# Patient Record
Sex: Female | Born: 1990 | Race: White | Hispanic: No | Marital: Single | Smoking: Never smoker
Health system: Southern US, Community
[De-identification: ages and names within clinical notes are randomized; demographics above are authoritative.]

## PROBLEM LIST (undated history)

## (undated) DIAGNOSIS — F909 Attention-deficit hyperactivity disorder, unspecified type: Secondary | ICD-10-CM

## (undated) HISTORY — DX: Attention-deficit hyperactivity disorder, unspecified type: F90.9

---

## 2014-09-28 ENCOUNTER — Telehealth: Payer: Self-pay | Admitting: Nurse Practitioner

## 2015-08-27 ENCOUNTER — Ambulatory Visit (INDEPENDENT_AMBULATORY_CARE_PROVIDER_SITE_OTHER): Payer: 59

## 2015-08-27 ENCOUNTER — Ambulatory Visit (INDEPENDENT_AMBULATORY_CARE_PROVIDER_SITE_OTHER): Payer: 59 | Admitting: Emergency Medicine

## 2015-08-27 VITALS — BP 110/66 | HR 76 | Temp 98.2°F | Resp 18 | Ht 68.0 in | Wt 147.0 lb

## 2015-08-27 DIAGNOSIS — Z309 Encounter for contraceptive management, unspecified: Secondary | ICD-10-CM

## 2015-08-27 DIAGNOSIS — M25572 Pain in left ankle and joints of left foot: Secondary | ICD-10-CM

## 2015-08-27 DIAGNOSIS — Z3202 Encounter for pregnancy test, result negative: Secondary | ICD-10-CM | POA: Diagnosis not present

## 2015-08-27 LAB — POCT URINE PREGNANCY: PREG TEST UR: NEGATIVE

## 2015-08-27 MED ORDER — NORETHIN ACE-ETH ESTRAD-FE 1-20 MG-MCG(24) PO TABS: 1.0000 | ORAL_TABLET | Freq: Every day | ORAL | Status: DC

## 2015-08-27 NOTE — Patient Instructions (Signed)
     IF you received an x-ray today, you will receive an invoice from [facility]. Please contact [facility] at [phone] with questions or concerns regarding your invoice.   IF you received labwork today, you will receive an invoice from [facility]. Please contact [facility] at [phone] with questions or concerns regarding your invoice.   Our billing staff will not be able to assist you with questions regarding bills from these companies.  You will be contacted with the lab results as soon as they are available. The fastest way to get your results is to activate your My Chart account. Instructions are located on the last page of this paperwork. If you have not heard from us regarding the results in 2 weeks, please contact this office.      

## 2015-08-27 NOTE — Progress Notes (Addendum)
By signing my name below, I, Raven Small, attest that this documentation has been prepared under the direction and in the presence of Ellamae Siaobert Doolittle, MD.  Electronically Signed: Andrew Auaven Small, ED Scribe. 08/27/2015. 3:21 PM.   Chief Complaint:  Chief Complaint  Patient presents with  . Ankle Pain    left ankle. Noticed the pain after hiking in [place] for 5 days.   . Birth Control    wants to discuss birth control pill options    HPI: Hailey Higgins is a 25 y.o. female who [facility] complaining of left ankle pain that began 2 days ago. Pt denies injury or fall. [place] While there she did a 5 day hike through the mountains which she finished 8 days ago. She denies known injury during the hike. She returned home from [place] 2 days ago. She put ice on ankle last night without relief.   Pt would also like to start birth control. LMP- 12 days ago. She denies chance of pregnancy. She has been on bc in the past but is unable to recall which one.   History reviewed. No pertinent past medical history. History reviewed. No pertinent past surgical history. Social History   Social History  . Marital Status: Single    Spouse Name: N/A  . Number of Children: N/A  . Years of Education: N/A   Social History Main Topics  . Smoking status: Never Smoker   . Smokeless tobacco: None  . Alcohol Use: 0.0 oz/week    0 Standard drinks or equivalent per week  . Drug Use: No  . Sexual Activity: Not Asked   Other Topics Concern  . None   Social History Narrative  . None   History reviewed. No pertinent family history. No Known Allergies Prior to Admission medications   Not on File     ROS: The patient denies fevers, chills, night sweats, unintentional weight loss, chest pain, palpitations, wheezing, dyspnea on exertion, nausea, vomiting, abdominal pain, dysuria, hematuria, melena, numbness, weakness, or tingling.   All other systems have been  reviewed and were otherwise negative with the exception of those mentioned in the HPI and as above.    PHYSICAL EXAM: Filed Vitals:   08/27/15 1426  BP: 110/66  Pulse: 76  Temp: 98.2 F (36.8 C)  Resp: 18   Body mass index is 22.36 kg/(m^2).   General: Alert, no acute distress HEENT:  Normocephalic, atraumatic, oropharynx patent. Eye: [facility]I, [facility] Cardiovascular:  Regular rate and rhythm, no rubs murmurs or gallops.  No Carotid bruits, radial pulse intact. No pedal edema.  Respiratory: Clear to auscultation bilaterally.  No wheezes, rales, or rhonchi.  No cyanosis, no use of accessory musculature Abdominal: No organomegaly, abdomen is soft and non-tender, positive bowel sounds.  No masses. Musculoskeletal: Gait intact. No edema.. Tender over the medial malleolus no increase in pain tuning fork. No swelling.  Skin: No rashes. subungual hematoma under great toe nail. Neurologic: Facial musculature symmetric. Psychiatric: Patient acts appropriately throughout our interaction. Lymphatic: No cervical or submandibular lymphadenopathy    LABS: Results for orders placed or performed in visit on 08/27/15  POCT urine pregnancy  Result Value Ref Range   Preg Test, Ur Negative Negative     EKG/XRAY:   Primary read [facility].  Dg Ankle Complete Left  08/27/2015  CLINICAL DATA:  LEFT ankle pain after long hiking trip EXAM: LEFT ANKLE COMPLETE - 3+ VIEW  COMPARISON:  None FINDINGS: Osseous mineralization normal. Joint spaces preserved. No fracture, dislocation, or bone destruction. IMPRESSION: Normal exam. Electronically Signed   By: Ulyses Southward M.D.   On: 08/27/2015 16:19    ASSESSMENT/PLAN: Patient started on birth control. She was placed in a ankle support. No fracture was seen. If she continues to have pain would suggest referral to orthopedics to rule out a stress fracture.I personally performed the services described in this documentation, which was scribed in  my presence. The recorded information has been reviewed and is accurate.   Gross sideeffects, risk and benefits, and alternatives of medications d/w patient. Patient is aware that all medications have potential sideeffects and we are unable to predict every sideeffect or drug-drug interaction that may occur.  Lesle Chris MD 08/27/2015 3:19 PM

## 2015-11-21 HISTORY — PX: CLOSED REDUCTION TIBIAL FRACTURE: SHX1361

## 2015-11-21 HISTORY — PX: EXTERNAL FIXATION ANKLE FRACTURE: SHX1548

## 2015-11-21 HISTORY — PX: PERCUTANEOUS PINNING TALUS FRACTURE: SUR1016

## 2015-11-21 HISTORY — PX: INCISION AND DRAINAGE TIBIA: SHX1809

## 2015-11-28 HISTORY — PX: IM NAILING TIBIA: SUR734

## 2015-11-28 HISTORY — PX: ORIF CALCANEOUS FRACTURE: SHX5030

## 2016-01-02 ENCOUNTER — Inpatient Hospital Stay: Admit: 2016-01-02 | Discharge: 2016-01-02 | Disposition: A | Discharge status: Home / Self Care | Payer: Self-pay

## 2016-02-01 ENCOUNTER — Inpatient Hospital Stay: Admit: 2016-02-01 | Discharge: 2016-02-01 | Disposition: A | Discharge status: Home / Self Care | Payer: Self-pay

## 2016-03-01 ENCOUNTER — Inpatient Hospital Stay: Admit: 2016-03-01 | Discharge: 2016-03-01 | Disposition: A | Discharge status: Home / Self Care | Payer: Self-pay

## 2016-03-29 ENCOUNTER — Inpatient Hospital Stay: Admit: 2016-03-29 | Discharge: 2016-03-29 | Disposition: A | Discharge status: Home / Self Care | Payer: Self-pay

## 2016-05-16 ENCOUNTER — Inpatient Hospital Stay: Admit: 2016-05-16 | Discharge: 2016-05-16 | Disposition: A | Discharge status: Home / Self Care | Payer: Self-pay

## 2016-05-16 DIAGNOSIS — S82292J Other fracture of shaft of left tibia, subsequent encounter for open fracture type IIIA, IIIB, or IIIC with delayed healing: Secondary | ICD-10-CM | POA: Diagnosis not present

## 2016-05-16 DIAGNOSIS — S82492J Other fracture of shaft of left fibula, subsequent encounter for open fracture type IIIA, IIIB, or IIIC with delayed healing: Secondary | ICD-10-CM | POA: Diagnosis not present

## 2016-05-16 DIAGNOSIS — S92111D Displaced fracture of neck of right talus, subsequent encounter for fracture with routine healing: Secondary | ICD-10-CM | POA: Diagnosis not present

## 2016-05-25 DIAGNOSIS — S82202J Unspecified fracture of shaft of left tibia, subsequent encounter for open fracture type IIIA, IIIB, or IIIC with delayed healing: Secondary | ICD-10-CM | POA: Diagnosis not present

## 2016-06-13 ENCOUNTER — Inpatient Hospital Stay: Admit: 2016-06-13 | Discharge: 2016-06-13 | Disposition: A | Discharge status: Home / Self Care | Payer: Self-pay

## 2016-06-13 DIAGNOSIS — S82492J Other fracture of shaft of left fibula, subsequent encounter for open fracture type IIIA, IIIB, or IIIC with delayed healing: Secondary | ICD-10-CM | POA: Diagnosis not present

## 2016-06-13 DIAGNOSIS — S82292J Other fracture of shaft of left tibia, subsequent encounter for open fracture type IIIA, IIIB, or IIIC with delayed healing: Secondary | ICD-10-CM | POA: Diagnosis not present

## 2016-06-13 DIAGNOSIS — S92111D Displaced fracture of neck of right talus, subsequent encounter for fracture with routine healing: Secondary | ICD-10-CM | POA: Diagnosis not present

## 2016-07-17 ENCOUNTER — Inpatient Hospital Stay: Admit: 2016-07-17 | Discharge: 2016-07-17 | Disposition: A | Discharge status: Home / Self Care | Payer: Self-pay

## 2016-07-17 DIAGNOSIS — S82492J Other fracture of shaft of left fibula, subsequent encounter for open fracture type IIIA, IIIB, or IIIC with delayed healing: Secondary | ICD-10-CM | POA: Diagnosis not present

## 2016-07-17 DIAGNOSIS — S82292J Other fracture of shaft of left tibia, subsequent encounter for open fracture type IIIA, IIIB, or IIIC with delayed healing: Secondary | ICD-10-CM | POA: Diagnosis not present

## 2016-07-19 DIAGNOSIS — M9902 Segmental and somatic dysfunction of thoracic region: Secondary | ICD-10-CM | POA: Diagnosis not present

## 2016-07-23 DIAGNOSIS — M9902 Segmental and somatic dysfunction of thoracic region: Secondary | ICD-10-CM | POA: Diagnosis not present

## 2016-11-08 ENCOUNTER — Other Ambulatory Visit: Payer: Self-pay | Admitting: Emergency Medicine

## 2016-11-14 ENCOUNTER — Inpatient Hospital Stay: Admit: 2016-11-14 | Discharge: 2016-11-14 | Disposition: A | Discharge status: Home / Self Care | Payer: Self-pay

## 2016-11-14 DIAGNOSIS — S92011D Displaced fracture of body of right calcaneus, subsequent encounter for fracture with routine healing: Secondary | ICD-10-CM | POA: Diagnosis not present

## 2016-11-14 DIAGNOSIS — S82292J Other fracture of shaft of left tibia, subsequent encounter for open fracture type IIIA, IIIB, or IIIC with delayed healing: Secondary | ICD-10-CM | POA: Diagnosis not present

## 2016-11-14 DIAGNOSIS — S92111D Displaced fracture of neck of right talus, subsequent encounter for fracture with routine healing: Secondary | ICD-10-CM | POA: Diagnosis not present

## 2016-11-14 DIAGNOSIS — S82492J Other fracture of shaft of left fibula, subsequent encounter for open fracture type IIIA, IIIB, or IIIC with delayed healing: Secondary | ICD-10-CM | POA: Diagnosis not present

## 2016-11-22 DIAGNOSIS — Z005 Encounter for examination of potential donor of organ and tissue: Secondary | ICD-10-CM | POA: Diagnosis not present

## 2017-01-18 DIAGNOSIS — Z9689 Presence of other specified functional implants: Secondary | ICD-10-CM | POA: Diagnosis not present

## 2017-01-18 DIAGNOSIS — M25871 Other specified joint disorders, right ankle and foot: Secondary | ICD-10-CM | POA: Diagnosis not present

## 2017-01-21 ENCOUNTER — Other Ambulatory Visit: Payer: Self-pay | Admitting: Orthopedic Surgery

## 2017-01-28 DIAGNOSIS — R0981 Nasal congestion: Secondary | ICD-10-CM | POA: Diagnosis not present

## 2017-01-28 DIAGNOSIS — J3089 Other allergic rhinitis: Secondary | ICD-10-CM | POA: Diagnosis not present

## 2017-01-31 ENCOUNTER — Encounter (HOSPITAL_BASED_OUTPATIENT_CLINIC_OR_DEPARTMENT_OTHER): Payer: Self-pay | Admitting: *Deleted

## 2017-02-07 ENCOUNTER — Ambulatory Visit (HOSPITAL_BASED_OUTPATIENT_CLINIC_OR_DEPARTMENT_OTHER): Admit: 2017-02-07 | Payer: Self-pay | Admitting: Orthopedic Surgery

## 2017-02-07 ENCOUNTER — Encounter (HOSPITAL_BASED_OUTPATIENT_CLINIC_OR_DEPARTMENT_OTHER): Payer: Self-pay

## 2017-02-07 SURGERY — ARTHROSCOPY, ANKLE, WITH MICROFRACTURE
Anesthesia: General | Laterality: Right

## 2017-05-15 DIAGNOSIS — N926 Irregular menstruation, unspecified: Secondary | ICD-10-CM | POA: Diagnosis not present

## 2017-06-18 ENCOUNTER — Encounter (HOSPITAL_BASED_OUTPATIENT_CLINIC_OR_DEPARTMENT_OTHER): Payer: Self-pay | Admitting: Emergency Medicine

## 2017-06-18 ENCOUNTER — Emergency Department (HOSPITAL_BASED_OUTPATIENT_CLINIC_OR_DEPARTMENT_OTHER)
Admission: EM | Admit: 2017-06-18 | Discharge: 2017-06-18 | Disposition: A | Discharge status: Home / Self Care | Payer: 59 | Attending: Emergency Medicine | Admitting: Emergency Medicine

## 2017-06-18 ENCOUNTER — Emergency Department (HOSPITAL_BASED_OUTPATIENT_CLINIC_OR_DEPARTMENT_OTHER): Payer: 59

## 2017-06-18 ENCOUNTER — Other Ambulatory Visit: Payer: Self-pay

## 2017-06-18 DIAGNOSIS — Z79899 Other long term (current) drug therapy: Secondary | ICD-10-CM | POA: Insufficient documentation

## 2017-06-18 DIAGNOSIS — R102 Pelvic and perineal pain: Secondary | ICD-10-CM | POA: Diagnosis not present

## 2017-06-18 DIAGNOSIS — R1030 Lower abdominal pain, unspecified: Secondary | ICD-10-CM | POA: Insufficient documentation

## 2017-06-18 DIAGNOSIS — R1032 Left lower quadrant pain: Secondary | ICD-10-CM | POA: Diagnosis not present

## 2017-06-18 DIAGNOSIS — M545 Low back pain: Secondary | ICD-10-CM | POA: Diagnosis not present

## 2017-06-18 LAB — PREGNANCY, URINE: Preg Test, Ur: NEGATIVE

## 2017-06-18 LAB — WET PREP, GENITAL
SPERM: NONE SEEN
TRICH WET PREP: NONE SEEN
YEAST WET PREP: NONE SEEN

## 2017-06-18 LAB — CBC WITH DIFFERENTIAL/PLATELET
BASOS PCT: 0 %
Basophils Absolute: 0 10*3/uL (ref 0.0–0.1)
EOS ABS: 0.1 10*3/uL (ref 0.0–0.7)
EOS PCT: 2 %
HCT: 37 % (ref 36.0–46.0)
Hemoglobin: 12.7 g/dL (ref 12.0–15.0)
Lymphocytes Relative: 23 %
Lymphs Abs: 1 10*3/uL (ref 0.7–4.0)
MCH: 31 pg (ref 26.0–34.0)
MCHC: 34.3 g/dL (ref 30.0–36.0)
MCV: 90.2 fL (ref 78.0–100.0)
MONOS PCT: 12 %
Monocytes Absolute: 0.5 10*3/uL (ref 0.1–1.0)
Neutro Abs: 2.6 10*3/uL (ref 1.7–7.7)
Neutrophils Relative %: 63 %
PLATELETS: 290 10*3/uL (ref 150–400)
RBC: 4.1 MIL/uL (ref 3.87–5.11)
RDW: 12.4 % (ref 11.5–15.5)
WBC: 4.1 10*3/uL (ref 4.0–10.5)

## 2017-06-18 LAB — URINALYSIS, ROUTINE W REFLEX MICROSCOPIC
Bilirubin Urine: NEGATIVE
GLUCOSE, UA: NEGATIVE mg/dL
Ketones, ur: NEGATIVE mg/dL
Leukocytes, UA: NEGATIVE
Nitrite: NEGATIVE
Protein, ur: NEGATIVE mg/dL
SPECIFIC GRAVITY, URINE: 1.01 (ref 1.005–1.030)
pH: 8 (ref 5.0–8.0)

## 2017-06-18 LAB — COMPREHENSIVE METABOLIC PANEL
ALK PHOS: 93 U/L (ref 38–126)
ALT: 29 U/L (ref 14–54)
AST: 25 U/L (ref 15–41)
Albumin: 3.5 g/dL (ref 3.5–5.0)
Anion gap: 4 — ABNORMAL LOW (ref 5–15)
BUN: 8 mg/dL (ref 6–20)
CALCIUM: 8.1 mg/dL — AB (ref 8.9–10.3)
CO2: 23 mmol/L (ref 22–32)
CREATININE: 0.62 mg/dL (ref 0.44–1.00)
Chloride: 107 mmol/L (ref 101–111)
Glucose, Bld: 94 mg/dL (ref 65–99)
Potassium: 3.8 mmol/L (ref 3.5–5.1)
Sodium: 134 mmol/L — ABNORMAL LOW (ref 135–145)
Total Bilirubin: 0.7 mg/dL (ref 0.3–1.2)
Total Protein: 6.8 g/dL (ref 6.5–8.1)

## 2017-06-18 LAB — URINALYSIS, MICROSCOPIC (REFLEX)

## 2017-06-18 LAB — LIPASE, BLOOD: LIPASE: 22 U/L (ref 11–51)

## 2017-06-18 MED ORDER — NAPROXEN 500 MG PO TABS: 500.0000 mg | ORAL_TABLET | Freq: Two times a day (BID) | ORAL | 0 refills | Status: DC

## 2017-06-18 MED ORDER — ONDANSETRON HCL 4 MG PO TABS: 4.0000 mg | ORAL_TABLET | Freq: Three times a day (TID) | ORAL | 0 refills | Status: DC | PRN

## 2017-06-18 MED ORDER — ONDANSETRON HCL 4 MG/2ML IJ SOLN: 4.0000 mg | Freq: Once | INTRAMUSCULAR | Status: DC

## 2017-06-18 MED ORDER — SODIUM CHLORIDE 0.9 % IV BOLUS (SEPSIS): 1000.0000 mL | Freq: Once | INTRAVENOUS | Status: DC

## 2017-06-18 NOTE — ED Triage Notes (Signed)
Pt states she started having lower back pain and pelvic yesterday.  Took a shower and felt lightheaded.  Pt states her paramedic friend gave her a liter of fluids and zofran odt.  Pt states the dizziness was relieved afterward.    Pt states she continues to have pelvic pain and light red vaginal discharge.

## 2017-06-18 NOTE — ED Provider Notes (Signed)
[facility] EMERGENCY DEPARTMENT Provider Note   CSN: [id number] Arrival date & time: 06/18/17  4098     History   Chief Complaint Chief Complaint  Patient presents with  . Pelvic Pain    HPI Hailey Higgins is a 27 y.o. female with no significant past medical history who presents to the ED today for lower abdominal pain. Patient notes that yesterday she awoke with left lower abdominal pain and lower back pain that she describes as constant, sharp in nature. She felt like everytime she stood she would become lightheaded, "like I was going to pass out". She took mildol, tylenol and was given IVF and zofran by a "paramedic friend", that relieved her lightheadedness. She notes that the pain has improved from yesterday and now is only in her left lower quadrant that now radiates into her pelvic area. She does report some associated light red vaginal discharge (she states she is unsure if this is blood) and hematuria. She is concerned this may be the cyst she had on her left ovary in previous years. Denies fever, chills, cough, congestion, CP, SOB, N/V/D, constipation, vaginal itching, dysuria, flank pain, urinary frequency, urinary urgency. LBM last night and normal. She is still passing gas. No melena or hematochezia. No previous abdominal surgeries. Patient is sexually active with 1 female partner. Does not always use protection. States she is not concerned for STD's. She is not sure if she may be pregnant.,   HPI  No past medical history on file.  There are no active problems to display for this patient.   Past Surgical History:  Procedure Laterality Date  . CLOSED REDUCTION TIBIAL FRACTURE Left 11/21/2015  . EXTERNAL FIXATION ANKLE FRACTURE Left 11/21/2015   distal tibia  . IM NAILING TIBIA Left 11/28/2015  . INCISION AND DRAINAGE TIBIA Left 11/21/2015  . ORIF CALCANEOUS FRACTURE Right 11/28/2015  . PERCUTANEOUS PINNING TALUS FRACTURE Right 11/21/2015   talar neck fx.    OB  History    No data available       Home Medications    Prior to Admission medications   Medication Sig Start Date End Date Taking? Authorizing Provider  Norethindrone Acetate-Ethinyl Estrad-FE (LOESTRIN 24 FE) 1-20 MG-MCG(24) tablet Take 1 tablet by mouth daily. 08/27/15   Collene Gobble, MD    Family History No family history on file.  Social History Social History   Tobacco Use  . Smoking status: Never Smoker  . Smokeless tobacco: Never Used  Substance Use Topics  . Alcohol use: Yes    Alcohol/week: 0.0 oz  . Drug use: No     Allergies   Patient has no known allergies.   Review of Systems Review of Systems  All other systems reviewed and are negative.    Physical Exam Updated Vital Signs BP 131/90 (BP Location: Right Arm)   Pulse 90   Temp 97.9 F (36.6 C) (Oral)   Resp 18   Ht 5\' 9"  (1.753 m)   Wt 72.6 kg (160 lb)   LMP 05/28/2017   SpO2 99%   BMI 23.63 kg/m   Physical Exam  Constitutional: She appears well-developed and well-nourished.  HENT:  Head: Normocephalic and atraumatic.  Right Ear: External ear normal.  Left Ear: External ear normal.  Nose: Nose normal.  Mouth/Throat: Uvula is midline, oropharynx is clear and moist and mucous membranes are normal. No tonsillar exudate.  Eyes: Pupils are equal, round, and reactive to light. Right eye exhibits no discharge. Left eye  exhibits no discharge. No scleral icterus.  Neck: Trachea normal. Neck supple. No spinous process tenderness present. No neck rigidity. Normal range of motion present.  Cardiovascular: Normal rate, regular rhythm and intact distal pulses.  No murmur heard. Pulses:      Radial pulses are 2+ on the right side, and 2+ on the left side.       Dorsalis pedis pulses are 2+ on the right side, and 2+ on the left side.       Posterior tibial pulses are 2+ on the right side, and 2+ on the left side.  No lower extremity swelling or edema. Calves symmetric in size bilaterally.    Pulmonary/Chest: Effort normal and breath sounds normal. She exhibits no tenderness.  Abdominal: Soft. Bowel sounds are normal. She exhibits no distension. There is tenderness in the left lower quadrant. There is no rigidity, no rebound, no guarding and no CVA tenderness.  Genitourinary:  Genitourinary Comments: Exam performed by Jacinto Halim, exam chaperoned Pelvic exam: normal external genitalia without evidence of trauma. VULVA: normal appearing vulva with no masses, tenderness or lesion. VAGINA: normal appearing vagina with normal color and discharge, no lesions. CERVIX: normal appearing cervix without lesions, cervical motion tenderness absent, cervical os closed with out purulent discharge. There is scant bloody discharge noted; vaginal discharge. Wet prep and DNA probe for chlamydia and GC obtained.   ADNEXA: normal adnexa in size, nontender and no masses UTERUS: uterus is normal size, shape, consistency and nontender.   Musculoskeletal: She exhibits no edema.  Lymphadenopathy:    She has no cervical adenopathy.  Neurological: She is alert.  Skin: Skin is warm and dry. No rash noted. She is not diaphoretic.  Psychiatric: She has a normal mood and affect.  Nursing note and vitals reviewed.    ED Treatments / Results  Labs (all labs ordered are listed, but only abnormal results are displayed) Labs Reviewed  WET PREP, GENITAL - Abnormal; Notable for the following components:      Result Value   Clue Cells Wet Prep HPF POC PRESENT (*)    WBC, Wet Prep HPF POC MANY (*)    All other components within normal limits  URINALYSIS, ROUTINE W REFLEX MICROSCOPIC - Abnormal; Notable for the following components:   Hgb urine dipstick LARGE (*)    All other components within normal limits  URINALYSIS, MICROSCOPIC (REFLEX) - Abnormal; Notable for the following components:   Bacteria, UA MANY (*)    Squamous Epithelial / LPF 6-30 (*)    All other components within normal limits   COMPREHENSIVE METABOLIC PANEL - Abnormal; Notable for the following components:   Sodium 134 (*)    Calcium 8.1 (*)    Anion gap 4 (*)    All other components within normal limits  PREGNANCY, URINE  CBC WITH DIFFERENTIAL/PLATELET  LIPASE, BLOOD  GC/CHLAMYDIA PROBE AMP ([facility]) NOT AT [facility]    EKG  EKG Interpretation None       Radiology US Transvaginal Non-ob  Result Date: 06/18/2017 CLINICAL DATA:  Syncopal episode yesterday. Left-sided pelvic discomfort began yesterday and is associated with a red vaginal discharge. History of ovarian cysts. Onset of last normal menstrual period was May 28, 2017. Negative urine pregnancy test. EXAM: TRANSABDOMINAL AND TRANSVAGINAL ULTRASOUND OF PELVIS DOPPLER ULTRASOUND OF OVARIES TECHNIQUE: Both transabdominal and transvaginal ultrasound examinations of the pelvis were performed. Transabdominal technique was performed for global imaging of the pelvis including uterus, ovaries, adnexal regions, and pelvic cul-de-sac. It was  necessary to proceed with endovaginal exam following the transabdominal exam to visualize the uterus, endometrium, ovaries, and adnexal structures. Color and duplex Doppler ultrasound was utilized to evaluate blood flow to the ovaries. COMPARISON:  None in PACs FINDINGS: Uterus Measurements: 7.6 x 2.9 x 4.0 cm. No fibroids or other mass visualized. Endometrium Thickness: 2.2 mm. No abnormal endometrial fluid collections or masses. Right ovary Measurements: 1.8 x 3.3 x 2.6 cm. Normal appearance/no adnexal mass. Left ovary Measurements: 2.8 x 1.9 x 3.0 cm. Normal appearance/no adnexal mass. Pulsed Doppler evaluation of both ovaries demonstrates normal low-resistance arterial and venous waveforms. Other findings There is a trace of free pelvic fluid. IMPRESSION: No evidence of ovarian torsion or adnexal masses. Normal appearing uterus and endometrium. Electronically Signed   By: David  Swazil[place]Jordan M.D.   On: 06/18/2017 12:13   [place] Pelvis  Complete  Result Date: 06/18/2017 CLINICAL DATA:  Syncopal episode yesterday. Left-sided pelvic discomfort began yesterday and is associated with a red vaginal discharge. History of ovarian cysts. Onset of last normal menstrual period was May 28, 2017. Negative urine pregnancy test. EXAM: TRANSABDOMINAL AND TRANSVAGINAL ULTRASOUND OF PELVIS DOPPLER ULTRASOUND OF OVARIES TECHNIQUE: Both transabdominal and transvaginal ultrasound examinations of the pelvis were performed. Transabdominal technique was performed for global imaging of the pelvis including uterus, ovaries, adnexal regions, and pelvic cul-de-sac. It was necessary to proceed with endovaginal exam following the transabdominal exam to visualize the uterus, endometrium, ovaries, and adnexal structures. Color and duplex Doppler ultrasound was utilized to evaluate blood flow to the ovaries. COMPARISON:  None in PACs FINDINGS: Uterus Measurements: 7.6 x 2.9 x 4.0 cm. No fibroids or other mass visualized. Endometrium Thickness: 2.2 mm. No abnormal endometrial fluid collections or masses. Right ovary Measurements: 1.8 x 3.3 x 2.6 cm. Normal appearance/no adnexal mass. Left ovary Measurements: 2.8 x 1.9 x 3.0 cm. Normal appearance/no adnexal mass. Pulsed Doppler evaluation of both ovaries demonstrates normal low-resistance arterial and venous waveforms. Other findings There is a trace of free pelvic fluid. IMPRESSION: No evidence of ovarian torsion or adnexal masses. Normal appearing uterus and endometrium. Electronically Signed   By: David  SwazilandJordan M.D.   On: 06/18/2017 12:13   [place] Art/ven Flow Abd Pelv Doppler  Result Date: 06/18/2017 CLINICAL DATA:  Syncopal episode yesterday. Left-sided pelvic discomfort began yesterday and is associated with a red vaginal discharge. History of ovarian cysts. Onset of last normal menstrual period was May 28, 2017. Negative urine pregnancy test. EXAM: TRANSABDOMINAL AND TRANSVAGINAL ULTRASOUND OF PELVIS DOPPLER  ULTRASOUND OF OVARIES TECHNIQUE: Both transabdominal and transvaginal ultrasound examinations of the pelvis were performed. Transabdominal technique was performed for global imaging of the pelvis including uterus, ovaries, adnexal regions, and pelvic cul-de-sac. It was necessary to proceed with endovaginal exam following the transabdominal exam to visualize the uterus, endometrium, ovaries, and adnexal structures. Color and duplex Doppler ultrasound was utilized to evaluate blood flow to the ovaries. COMPARISON:  None in PACs FINDINGS: Uterus Measurements: 7.6 x 2.9 x 4.0 cm. No fibroids or other mass visualized. Endometrium Thickness: 2.2 mm. No abnormal endometrial fluid collections or masses. Right ovary Measurements: 1.8 x 3.3 x 2.6 cm. Normal appearance/no adnexal mass. Left ovary Measurements: 2.8 x 1.9 x 3.0 cm. Normal appearance/no adnexal mass. Pulsed Doppler evaluation of both ovaries demonstrates normal low-resistance arterial and venous waveforms. Other findings There is a trace of free pelvic fluid. IMPRESSION: No evidence of ovarian torsion or adnexal masses. Normal appearing uterus and endometrium. Electronically Signed   By: David  SwazilandJordan  M.D.   On: 06/18/2017 12:13    Procedures Procedures (including critical care time)  Medications Ordered in ED Medications - No data to display   Initial Impression / Assessment and Plan / ED Course  I have reviewed the triage vital signs and the nursing notes.  Pertinent labs & imaging results that were available during my care of the patient were reviewed by me and considered in my medical decision making (see chart for details).     27 year old female presenting with left lower abdominal pain and back pain since last night. Notes some associated vaginal discharge and pelvic pain. She received IVF and zofran PTA that helped her with her symptoms. On exam the patient is noted to have TTP of the left lower quadrant without peritoneal signs. On  pelvic exam the patient was noted to have closed cervical os with some scant bloody discharge, and left adenxal ttp. No CMT and patient is without fever. Do not suspect PID. Will obtain blood work, UA and [place] to rule out torsion.   UA with low suspicion for UTI. Contaminate noted with epithelial cells. Pregnancy test negative. Do not suspect this is do to ruptured ectopic. Lipase wnl. No leukocytosis. No anemia. Cr wnl. Mild hyponatremia. No hypokalemia. Wet prep negative for trich and yeast. Will treat for bv with flagyl. [place] without evidence of ovarian torsion.   Patient is nontoxic, nonseptic appearing, in no apparent distress.  Patient's pain and other symptoms adequately managed in emergency department.  Labs, imaging and vitals reviewed.  Patient does not meet the SIRS or Sepsis criteria.  On repeat exam patient does not have a surgical abdomin and there are no peritoneal signs.  No indication of appendicitis, bowel obstruction, bowel perforation, cholecystitis, PID or ectopic pregnancy. Low suspicion for kidney stone, diverticulitis or obstruction. Discussed with patient the next step in evaluation would be CT scan. We discussed risk and benefits. Patient wishes to try conservative therapy and follow up if pain worsens does not resolve. Patient is without evidence of UTI or AKI that would would be concering if she was to have kidney stone. Feels she can be treated conservatively. She understands she can return if symptoms do not improve or she develops new concerning symptoms. Return precautions were discussed. Patient discharged home with symptomatic treatment and given strict instructions for follow-up with their primary care physician. Patient expresses understanding and agrees with plan.  Final Clinical Impressions(s) / ED Diagnoses   Final diagnoses:  Lower abdominal pain       Jacinto Halim, PA-C 06/18/17 2216    Charlynne Pander, MD 06/21/17 0700

## 2017-06-18 NOTE — Discharge Instructions (Signed)
Please read and follow all provided instructions You have been seen today for your complaint of: lower abdominal pain and pelvic pain Your lab work: Was reassuring.  Your imaging: Did not show an evidence of ovarian torsion or pevlic abnormality.  Vital signs: See below  Abdominal Pain  Your exam might not show the exact reason you have abdominal pain. Since there are many different causes of abdominal pain, another checkup and more tests may be needed. It is very important to follow up for lasting (persistent) or worsening symptoms. A possible cause of abdominal pain in any person who still has his or her appendix is acute appendicitis. Appendicitis is often hard to diagnose. Normal blood tests, urine tests, ultrasound, and CT scans do not completely rule out early appendicitis or other causes of abdominal pain. Sometimes, only the changes that happen over time will allow appendicitis and other causes of abdominal pain to be determined. Other potential problems that may require surgery may also take time to become more apparent. Because of this, it is important that you follow all of the instructions below.   HOME CARE INSTRUCTIONS  Do not take laxatives unless directed by your caregiver. Rest as much as possible.  Do not eat solid food until your pain is gone: A diet of water, weak decaffeinated tea, broth or bouillon, gelatin, oral rehydration solutions (ORS), frozen ice pops, or ice chips may be helpful.  When pain is gone: Start a light diet (dry toast, crackers, applesauce, or white rice). Increase the diet slowly as long as it does not bother you. Eat no dairy products (including cheese and eggs) and no spicy, fatty, fried, or high-fiber foods.  Use no alcohol, caffeine, or cigarettes.  Take your regular medicines unless your caregiver told you not to.  Take any prescribed medicine as directed.   SEEK IMMEDIATE MEDICAL CARE IF:  The pain does not go away.  You have a fever >101 that does  not go down with medication. You keep throwing up (vomiting) or cannot drink liquids.  You have shaking chills (rigors) The pain becomes localized (Pain in the right side could possibly be appendicitis. In an adult, pain in the left lower portion of the abdomen could be colitis or diverticulitis). You pass bloody or black tarry stools.  There is bright red blood in the stool.  There is blood in your vomit. Your bowel movements stop (become blocked) or you cannot pass gas.  The constipation stays for more than 4 days.  You have bloody, frequent, or painful urination.  You have yellow discoloration in the skin or whites of the eyes.  Your stomach becomes bloated or bigger.  You have dizziness or fainting.  You have chest or back pain. You have rectal pain.  You do not seem to be getting better.  You have any questions or concerns.   Your vital signs today were: BP 112/68 (BP Location: Right Arm)    Pulse 72    Temp 97.9 F (36.6 C) (Oral)    Resp 18    Ht 5\' 9"  (1.753 m)    Wt 72.6 kg (160 lb)    LMP 05/28/2017    SpO2 99%    BMI 23.63 kg/m  If your blood pressure (bp) was elevated above 135/85 this visit, please have this repeated by your doctor within one month.

## 2017-06-18 NOTE — ED Notes (Signed)
Patient transported to Ultrasound 

## 2017-06-19 LAB — GC/CHLAMYDIA PROBE AMP (~~LOC~~) NOT AT ARMC
CHLAMYDIA, DNA PROBE: NEGATIVE
NEISSERIA GONORRHEA: NEGATIVE

## 2017-11-14 DIAGNOSIS — B86 Scabies: Secondary | ICD-10-CM | POA: Diagnosis not present

## 2017-11-14 DIAGNOSIS — L0100 Impetigo, unspecified: Secondary | ICD-10-CM | POA: Diagnosis not present

## 2017-11-23 ENCOUNTER — Emergency Department (HOSPITAL_COMMUNITY): Payer: 59

## 2017-11-23 ENCOUNTER — Emergency Department (HOSPITAL_COMMUNITY)
Admission: EM | Admit: 2017-11-23 | Discharge: 2017-11-23 | Disposition: A | Discharge status: Home / Self Care | Payer: 59 | Attending: Emergency Medicine | Admitting: Emergency Medicine

## 2017-11-23 DIAGNOSIS — Z79899 Other long term (current) drug therapy: Secondary | ICD-10-CM | POA: Insufficient documentation

## 2017-11-23 DIAGNOSIS — R519 Headache, unspecified: Secondary | ICD-10-CM

## 2017-11-23 DIAGNOSIS — R51 Headache: Secondary | ICD-10-CM | POA: Insufficient documentation

## 2017-11-23 LAB — I-STAT CHEM 8, ED
BUN: 7 mg/dL (ref 6–20)
CREATININE: 0.6 mg/dL (ref 0.44–1.00)
Calcium, Ion: 1.09 mmol/L — ABNORMAL LOW (ref 1.15–1.40)
Chloride: 107 mmol/L (ref 98–111)
GLUCOSE: 87 mg/dL (ref 70–99)
HEMATOCRIT: 37 % (ref 36.0–46.0)
HEMOGLOBIN: 12.6 g/dL (ref 12.0–15.0)
POTASSIUM: 3.6 mmol/L (ref 3.5–5.1)
Sodium: 140 mmol/L (ref 135–145)
TCO2: 23 mmol/L (ref 22–32)

## 2017-11-23 MED ORDER — PROCHLORPERAZINE EDISYLATE 10 MG/2ML IJ SOLN
10.0000 mg | Freq: Once | INTRAMUSCULAR | Status: AC
  Administered 2017-11-23: 10 mg via INTRAVENOUS
  Filled 2017-11-23: qty 2

## 2017-11-23 MED ORDER — ACETAMINOPHEN 325 MG PO TABS
650.0000 mg | ORAL_TABLET | Freq: Once | ORAL | Status: DC
  Filled 2017-11-23: qty 2

## 2017-11-23 MED ORDER — IBUPROFEN 800 MG PO TABS
800.0000 mg | ORAL_TABLET | Freq: Once | ORAL | Status: DC
  Filled 2017-11-23: qty 1

## 2017-11-23 MED ORDER — SODIUM CHLORIDE 0.9 % IV BOLUS
1000.0000 mL | Freq: Once | INTRAVENOUS | Status: AC
  Administered 2017-11-23: 1000 mL via INTRAVENOUS

## 2017-11-23 NOTE — Discharge Instructions (Signed)
Please return if you have worsening symptoms, especially weakness or difficulty speaking. Follow up with your doctor next week.

## 2017-11-23 NOTE — ED Provider Notes (Signed)
[facility] Provider Note   CSN: [id number] Arrival date & time: 11/23/17  [id number]     History   Chief Complaint Chief Complaint  Patient presents with  . Headache    HPI Hailey Higgins is a 27 y.o. female.  HPI 27 year old female presents today complaining of headache.  She states that she has had some headaches in the past but this appears to be different.  She headache on the way to work today.  She then became nauseated and vomited several times.  In the past she has had nausea and vomiting one time which then resolved her symptoms.  Today she continued to feel poorly and felt very lightheaded.  She has not had any lateralized weakness, vision changes, fever, or neck pain. No past medical history on file.  There are no active problems to display for this patient.   Past Surgical History:  Procedure Laterality Date  . CLOSED REDUCTION TIBIAL FRACTURE Left 11/21/2015  . EXTERNAL FIXATION ANKLE FRACTURE Left 11/21/2015   distal tibia  . IM NAILING TIBIA Left 11/28/2015  . INCISION AND DRAINAGE TIBIA Left 11/21/2015  . ORIF CALCANEOUS FRACTURE Right 11/28/2015  . PERCUTANEOUS PINNING TALUS FRACTURE Right 11/21/2015   talar neck fx.     OB History   None      Home Medications    Prior to Admission medications   Medication Sig Start Date End Date Taking? Authorizing Provider  amoxicillin (AMOXIL) 500 MG capsule Take 500 mg by mouth 4 (four) times daily. 11/22/17 11/29/17 Yes [provider]  Norethindrone Acetate-Ethinyl Estrad-FE (LOESTRIN 24 FE) 1-20 MG-MCG(24) tablet Take 1 tablet by mouth daily. 08/27/15  Yes Collene Gobbleaub, Steven A, MD  naproxen (NAPROSYN) 500 MG tablet Take 1 tablet (500 mg total) by mouth 2 (two) times daily. Patient not taking: Reported on 11/23/2017 06/18/17   Jacinto HalimMaczis, Michael M, PA-C  ondansetron (ZOFRAN) 4 MG tablet Take 1 tablet (4 mg total) by mouth every 8 (eight) hours as needed for nausea or vomiting. Patient  not taking: Reported on 11/23/2017 06/18/17   Jacinto HalimMaczis, Michael M, PA-C    Family History No family history on file.  Social History Social History   Tobacco Use  . Smoking status: Never Smoker  . Smokeless tobacco: Never Used  Substance Use Topics  . Alcohol use: Yes    Alcohol/week: 0.0 oz  . Drug use: No     Allergies   Patient has no known allergies.   Review of Systems Review of Systems  All other systems reviewed and are negative.    Physical Exam Updated Vital Signs BP 131/88 (BP Location: Right Arm)   Pulse 92   Temp 98 F (36.7 C) (Oral)   Resp 18   LMP 11/23/2017   SpO2 100%   Physical Exam  Constitutional: She is oriented to person, place, and time. She appears well-developed and well-nourished.  HENT:  Head: Normocephalic and atraumatic.  Mouth/Throat: Oropharynx is clear and moist.  Eyes: Pupils are equal, round, and reactive to light. EOM are normal.  Cardiovascular: Normal rate, regular rhythm, normal heart sounds and intact distal pulses.  Pulmonary/Chest: Effort normal and breath sounds normal.  Abdominal: Soft. Bowel sounds are normal.  Musculoskeletal: Normal range of motion.  Neurological: She is alert and oriented to person, place, and time. She has normal strength. She displays a negative Romberg sign.  Skin: Skin is warm and dry.  Psychiatric: She has a normal mood and affect. Her behavior  is normal.  Nursing note and vitals reviewed.    ED Treatments / Results  Labs (all labs ordered are listed, but only abnormal results are displayed) Labs Reviewed  I-STAT CHEM 8, ED - Abnormal; Notable for the following components:      Result Value   Calcium, Ion 1.09 (*)    All other components within normal limits    EKG None  Radiology Ct Head Wo Contrast  Result Date: 11/23/2017 CLINICAL DATA:  Acute onset of severe headache this morning. Nausea and vomiting. EXAM: CT HEAD WITHOUT CONTRAST TECHNIQUE: Contiguous axial images were obtained  from the base of the skull through the vertex without intravenous contrast. COMPARISON:  None. FINDINGS: Brain: No evidence of acute infarction, hemorrhage, hydrocephalus, extra-axial collection, or mass lesion/mass effect. Vascular:  No hyperdense vessel or other acute findings. Skull: No evidence of fracture or other significant bone abnormality. Sinuses/Orbits:  No acute findings. Other: None. IMPRESSION: Negative noncontrast head CT. Electronically Signed   By: Myles Rosenthal M.D.   On: 11/23/2017 11:26    Procedures Procedures (including critical care time)  Medications Ordered in ED Medications  sodium chloride 0.9 % bolus 1,000 mL (1,000 mLs Intravenous New Bag/Given 11/23/17 1124)  prochlorperazine (COMPAZINE) injection 10 mg (10 mg Intravenous Given 11/23/17 1124)     Initial Impression / Assessment and Plan / ED Course  I have reviewed the triage vital signs and the nursing notes.  Pertinent labs & imaging results that were available during my care of the patient were reviewed by me and considered in my medical decision making (see chart for details).     Patient with some improvement in her headache after Compazine.  Head CT without any evidence of bleed.  Patient has been up and ambulated with a normal neurological exam.  She appears stable for discharge at this time.  She is advised to return precautions and voiced understanding. Final Clinical Impressions(s) / ED Diagnoses   Final diagnoses:  Nonintractable headache, unspecified chronicity pattern, unspecified headache type    ED Discharge Orders    None       Margarita Grizzle, MD 11/23/17 1354

## 2017-11-23 NOTE — ED Notes (Signed)
States just began amoxicillin yesterday. Has not taken it today.

## 2017-11-23 NOTE — ED Triage Notes (Signed)
Pt to ER for evaluation of headache onset this morning on awakening with nausea and vomiting. Denies hx of migraines. VSS. Pt tearful. Pupils equal and reactive, no neuro deficits. Ambulatory. Reports recently on amoxicillin for an infected tooth.

## 2018-04-03 ENCOUNTER — Other Ambulatory Visit: Payer: Self-pay | Admitting: Nurse Practitioner

## 2018-04-03 ENCOUNTER — Other Ambulatory Visit (HOSPITAL_COMMUNITY)
Admission: RE | Admit: 2018-04-03 | Discharge: 2018-04-03 | Disposition: A | Discharge status: Home / Self Care | Payer: 59 | Source: Ambulatory Visit | Attending: Obstetrics and Gynecology | Admitting: Obstetrics and Gynecology

## 2018-04-03 DIAGNOSIS — Z01419 Encounter for gynecological examination (general) (routine) without abnormal findings: Secondary | ICD-10-CM | POA: Diagnosis present

## 2018-04-07 LAB — CYTOLOGY - PAP: Diagnosis: NEGATIVE

## 2018-08-07 IMAGING — CT CT HEAD W/O CM
3 of 4 series · 16 of 47 positions shown, 19 images · non-contrast
Comparison: None.

CLINICAL DATA: Acute onset of severe headache this morning. Nausea
and vomiting.

EXAM:
CT HEAD WITHOUT CONTRAST
TECHNIQUE: Contiguous axial images were obtained from the base of the skull
through the vertex without intravenous contrast.

[Series 4: head 2.0 h70h · axial · 0.47mm/px · z∈[-85,+65]mm · 10 of 85 slices shown, 13 images]
[im 5/85  brain]
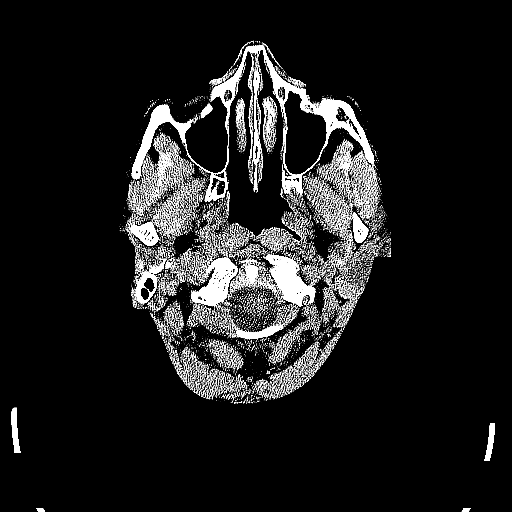
[im 5/85  bone]
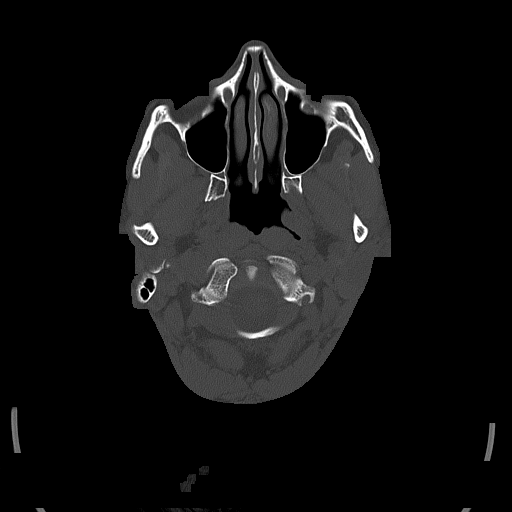
[im 13/85  brain]
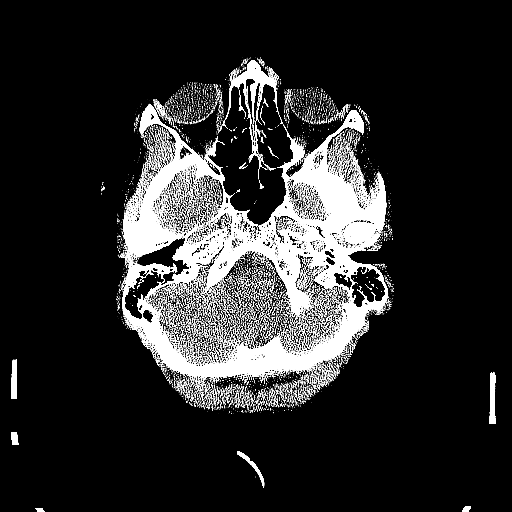
[im 22/85  brain]
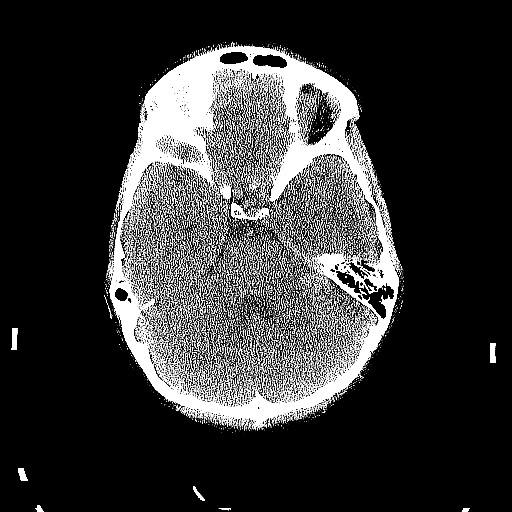
[im 30/85  brain]
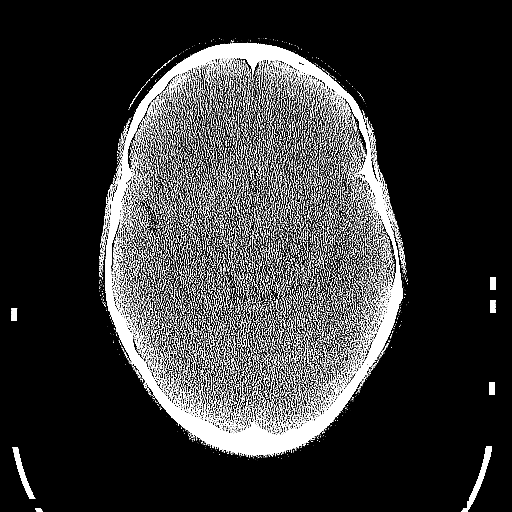
[im 38/85  brain]
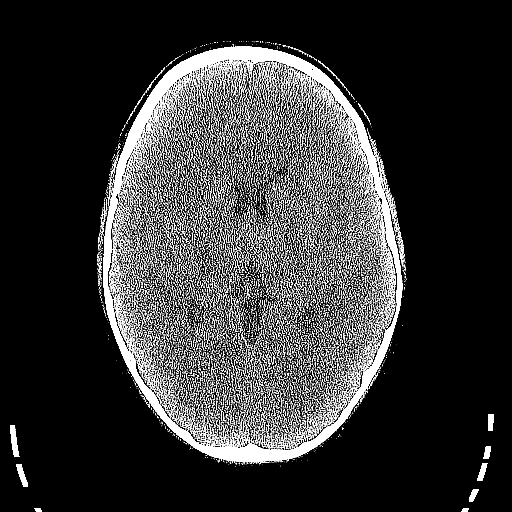
[im 38/85  bone]
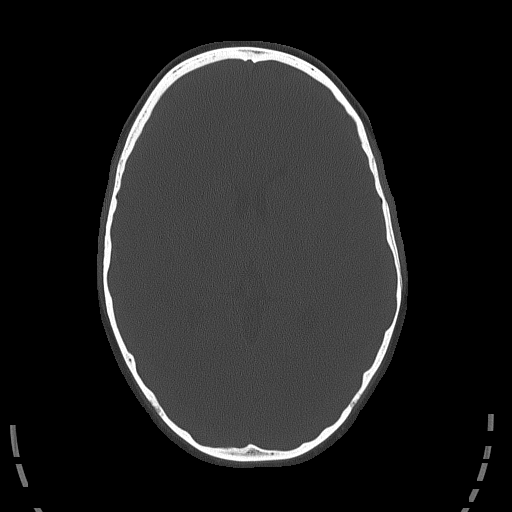
[im 47/85  brain]
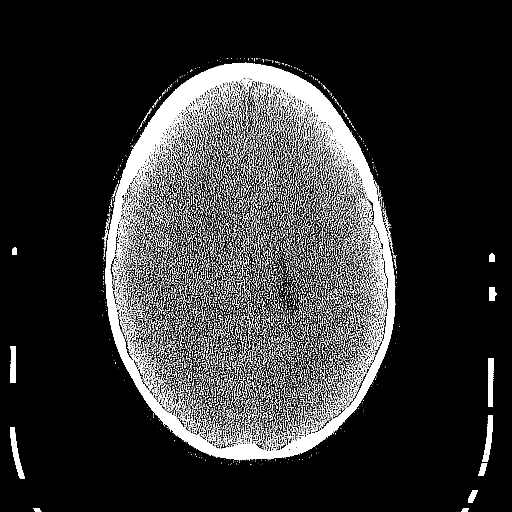
[im 55/85  brain]
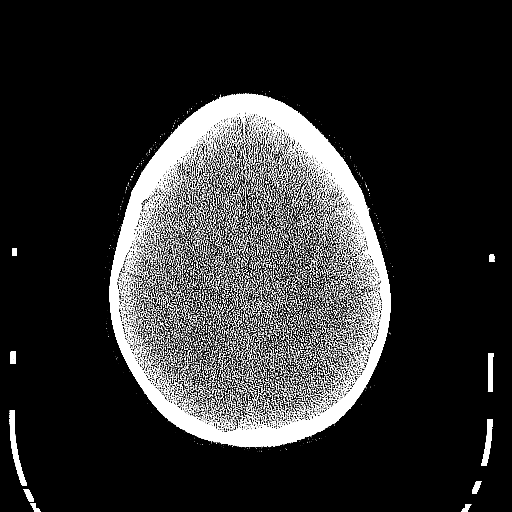
[im 64/85  brain]
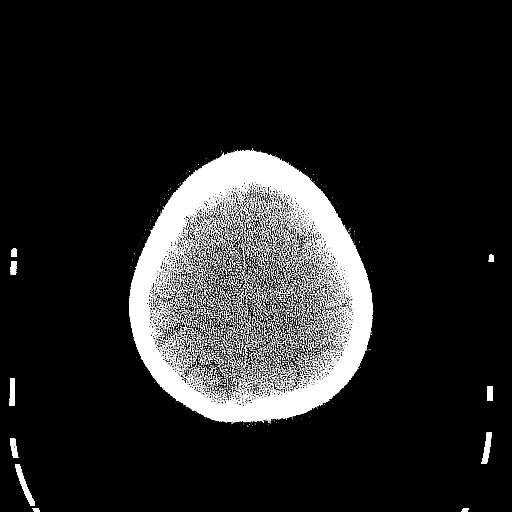
[im 72/85  brain]
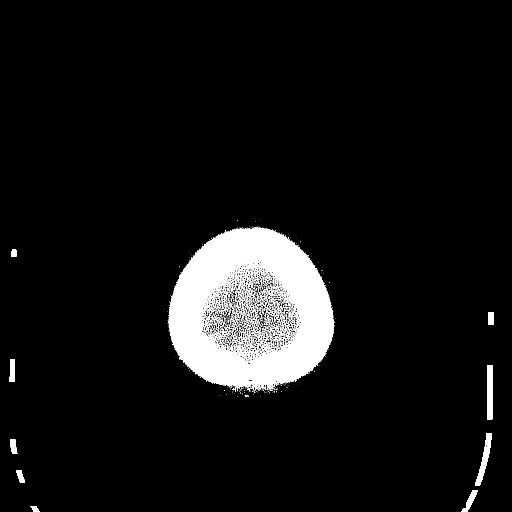
[im 72/85  bone]
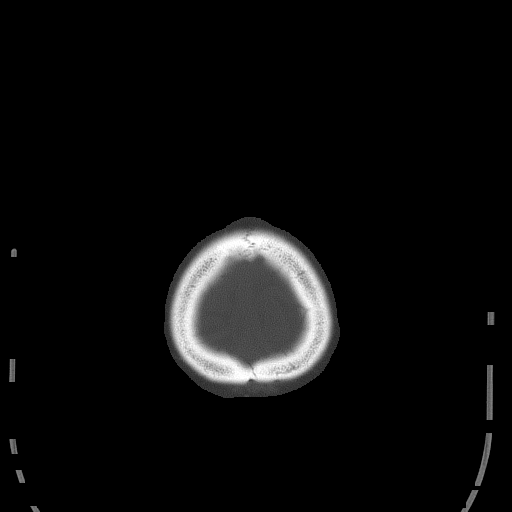
[im 80/85  brain]
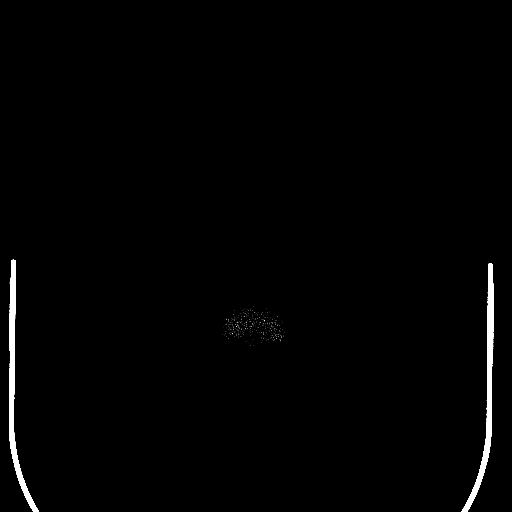

[Series 5: head 3.0 mpr cor · coronal · 0.36mm/px · 3 of 73 slices shown]
[im 25/73  brain]
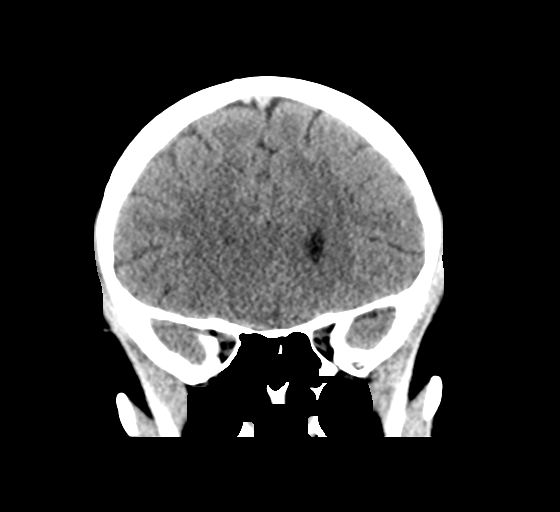
[im 33/73  brain]
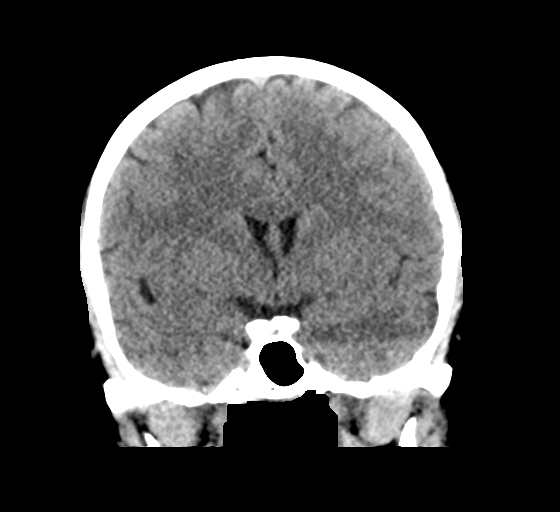
[im 41/73  brain]
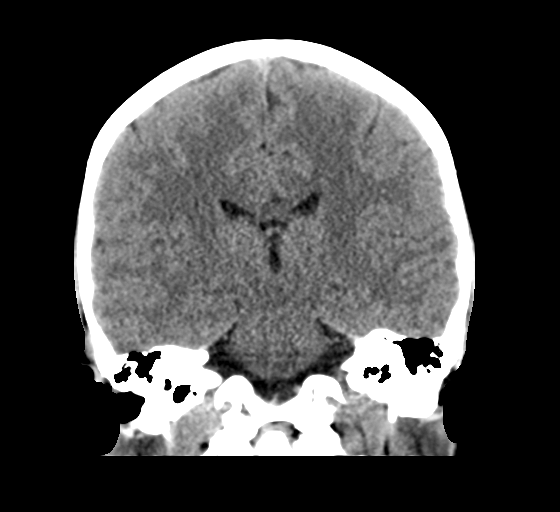

[Series 6: head 3.0 mpr sag · sagittal · 0.37mm/px · 3 of 67 slices shown]
[im 23/67  brain]
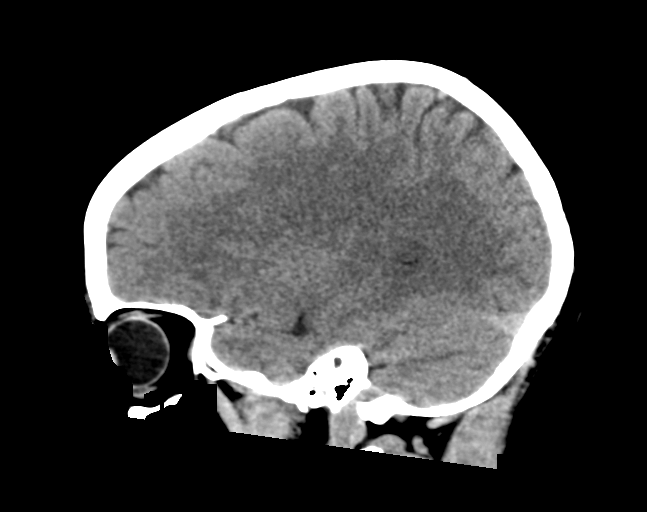
[im 34/67  brain]
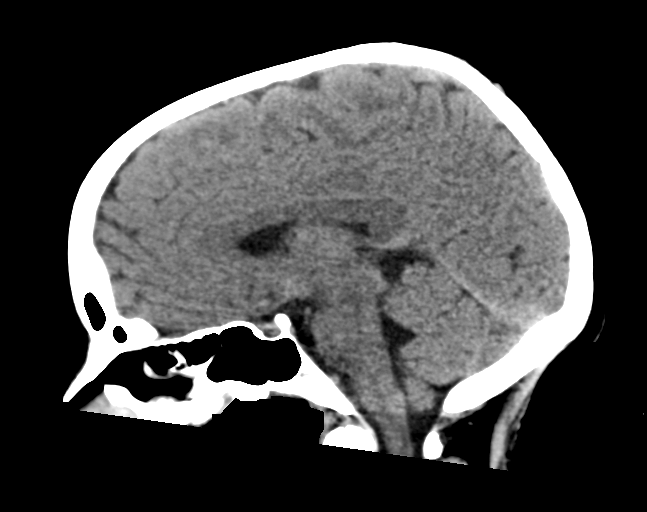
[im 45/67  brain]
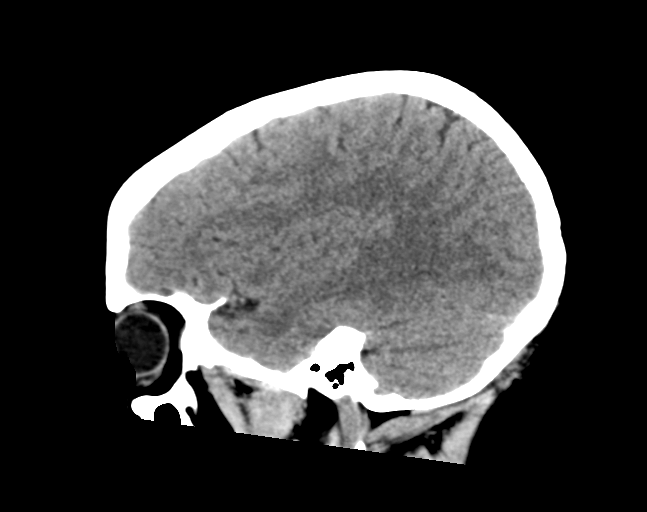

[16 of 47 positions shown; findings below may reference images not displayed]

FINDINGS: Brain: No evidence of acute infarction, hemorrhage, hydrocephalus,
extra-axial collection, or mass lesion/mass effect.

Vascular:  No hyperdense vessel or other acute findings.

Skull: No evidence of fracture or other significant bone
abnormality.

Sinuses/Orbits:  No acute findings.

Other: None.
IMPRESSION: Negative noncontrast head CT.

## 2019-03-02 ENCOUNTER — Inpatient Hospital Stay: Admit: 2019-03-02 | Discharge: 2019-03-02 | Disposition: A | Discharge status: Home / Self Care | Payer: Self-pay

## 2019-04-09 ENCOUNTER — Other Ambulatory Visit: Payer: Self-pay

## 2019-04-30 ENCOUNTER — Encounter: Payer: Self-pay | Admitting: Orthopedic Surgery

## 2019-04-30 ENCOUNTER — Ambulatory Visit: Payer: PRIVATE HEALTH INSURANCE | Admitting: Orthopedic Surgery

## 2019-04-30 VITALS — BP 139/80 | HR 99 | Ht 69.0 in | Wt 158.0 lb

## 2019-04-30 DIAGNOSIS — M25571 Pain in right ankle and joints of right foot: Secondary | ICD-10-CM

## 2019-04-30 DIAGNOSIS — M25671 Stiffness of right ankle, not elsewhere classified: Secondary | ICD-10-CM
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
## 2020-11-16 DIAGNOSIS — G5600 Carpal tunnel syndrome, unspecified upper limb: Secondary | ICD-10-CM | POA: Insufficient documentation

## 2020-11-25 DIAGNOSIS — R350 Frequency of micturition: Secondary | ICD-10-CM | POA: Insufficient documentation

## 2020-11-25 DIAGNOSIS — N3281 Overactive bladder: Secondary | ICD-10-CM | POA: Insufficient documentation

## 2020-11-25 DIAGNOSIS — R3915 Urgency of urination: Secondary | ICD-10-CM | POA: Insufficient documentation

## 2021-06-27 DIAGNOSIS — K5909 Other constipation: Secondary | ICD-10-CM | POA: Insufficient documentation

## 2021-06-27 DIAGNOSIS — F419 Anxiety disorder, unspecified: Secondary | ICD-10-CM | POA: Insufficient documentation

## 2021-12-04 ENCOUNTER — Other Ambulatory Visit: Payer: Self-pay | Admitting: Nurse Practitioner

## 2021-12-04 ENCOUNTER — Other Ambulatory Visit (HOSPITAL_COMMUNITY)
Admission: RE | Admit: 2021-12-04 | Discharge: 2021-12-04 | Disposition: A | Discharge status: Home / Self Care | Payer: 59 | Source: Ambulatory Visit | Attending: Nurse Practitioner | Admitting: Nurse Practitioner

## 2021-12-04 DIAGNOSIS — Z124 Encounter for screening for malignant neoplasm of cervix: Secondary | ICD-10-CM | POA: Diagnosis present

## 2021-12-06 LAB — CYTOLOGY - PAP
Comment: NEGATIVE
Diagnosis: NEGATIVE
High risk HPV: NEGATIVE

## 2022-05-21 ENCOUNTER — Encounter: Payer: Self-pay | Admitting: Family Medicine

## 2022-05-21 ENCOUNTER — Ambulatory Visit (INDEPENDENT_AMBULATORY_CARE_PROVIDER_SITE_OTHER): Payer: 59 | Admitting: Family Medicine

## 2022-05-21 VITALS — BP 120/82 | HR 114 | Temp 98.1°F | Resp 17 | Ht 70.0 in | Wt 165.0 lb

## 2022-05-21 DIAGNOSIS — R000 Tachycardia, unspecified: Secondary | ICD-10-CM

## 2022-05-21 DIAGNOSIS — R002 Palpitations: Secondary | ICD-10-CM
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
## 2022-05-21 NOTE — Patient Instructions (Signed)
Thanks for coming in today.  I will check some labs and let you know if there are any concerns and we will refer you to cardiology to discuss elevated heart rate further.  Continue to minimize caffeine intake as much as possible.   Sinus Tachycardia  Sinus tachycardia is a fast heartbeat. In sinus tachycardia, the heart beats more than 100 times a minute. Sinus tachycardia starts in the part of the heart called the sinoatrial (SA) node. Sinus tachycardia may be harmless, or it may be a sign of a serious condition. What are the causes? This condition may be caused by: Exercise or exertion. A fever. Pain. Loss of body fluids (dehydration). Severe bleeding (hemorrhage). Anxiety and stress. Certain substances, including: Alcohol. Caffeine. Tobacco and nicotine products. Cold medicines. Illegal drugs. Medical conditions including: Heart disease. An infection. An overactive thyroid (hyperthyroidism). A lack of red blood cells (anemia). What are the signs or symptoms? Symptoms of this condition include: A feeling that the heart is beating fast or unevenly (palpitations). Suddenly noticing your heartbeat (cardiac awareness). Lightheadedness. Tiredness (fatigue). Shortness of breath. Chest pain. Nausea. Fainting. How is this diagnosed? This condition is diagnosed with: A physical exam. Tests or monitoring, such as: Blood tests. An electrocardiogram (ECG). This test measures the electrical activity of the heart. Ambulatory cardiac monitor. This records your heartbeats for 24 hours or more. You may be referred to a heart specialist (cardiologist). How is this treated? Treatment for this condition depends on the cause. Treatment may involve: Treating the underlying condition. Taking new medicines or changing your current medicines as told by your health care provider. Making changes to your diet or lifestyle. Follow these instructions at home: Lifestyle  Do not use any products  that contain nicotine or tobacco. These products include cigarettes, chewing tobacco, and vaping devices, such as e-cigarettes. If you need help quitting, ask your health care provider. Do not use illegal drugs, such as cocaine. Learn relaxation methods to help you when you get stressed or anxious. These include deep breathing. Avoid caffeine or other stimulants, including herbal stimulants that are found in energy drinks. Alcohol use  Do not drink alcohol if: Your health care provider tells you not to drink. You are pregnant, may be pregnant, or are planning to become pregnant. If you drink alcohol: Limit how much you have to: 0-1 drink a day for women. 0-2 drinks a day for men. Know how much alcohol is in your drink. In the [place] one drink equals one 12 oz bottle of beer (355 mL), one 5 oz glass of wine (148 mL), or one 1 oz glass of hard liquor (44 mL). General instructions Drink enough fluids to keep your urine pale yellow. Take over-the-counter and prescription medicines only as told by your health care provider. Ask your health care provider about taking vitamins, herbs, and supplements. Contact a health care provider if: You have vomiting or diarrhea that does not go away. You have a fever. You have weakness or dizziness. You feel faint. Get help right away if: You have pain in your chest, upper arms, jaw, or neck. You have palpitations that do not go away. Summary In sinus tachycardia, the heart beats more than 100 times a minute. Sinus tachycardia may be harmless, or it may be a sign of a serious condition. Treatment for this condition depends on the cause or the underlying condition. Get help right away if you have pain in your chest, upper arms, jaw, or neck. This information is not  intended to replace advice given to you by your health care provider. Make sure you discuss any questions you have with your health care provider. Document Revised: 08/08/2021 Document  Reviewed: 08/08/2021 Elsevier Patient Education  [place].

## 2022-05-22 LAB — BASIC METABOLIC PANEL
BUN: 9 mg/dL (ref 6–23)
CO2: 26 mEq/L (ref 19–32)
Calcium: 9.2 mg/dL (ref 8.4–10.5)
Chloride: 104 mEq/L (ref 96–112)
Creatinine, Ser: 0.62 mg/dL (ref 0.40–1.20)
GFR: 117.76 mL/min (ref >60.00)
Glucose, Bld: 86 mg/dL (ref 70–99)
Potassium: 4.2 mEq/L (ref 3.5–5.1)
Sodium: 136 mEq/L (ref 135–145)

## 2022-05-22 LAB — CBC
HCT: 37.6 % (ref 36.0–46.0)
Hemoglobin: 12.9 g/dL (ref 12.0–15.0)
MCHC: 34.2 g/dL (ref 30.0–36.0)
MCV: 88.9 fl (ref 78.0–100.0)
Platelets: 306 10*3/uL (ref 150.0–400.0)
RBC: 4.23 Mil/uL (ref 3.87–5.11)
RDW: 13.7 % (ref 11.5–15.5)
WBC: 6.2 10*3/uL (ref 4.0–10.5)

## 2022-05-22 LAB — TSH: TSH: 0.58 u[IU]/mL (ref 0.35–5.50)

## 2022-05-30 ENCOUNTER — Encounter: Payer: Self-pay | Admitting: Internal Medicine

## 2022-05-30 ENCOUNTER — Ambulatory Visit: Payer: 59 | Attending: Internal Medicine | Admitting: Internal Medicine

## 2022-05-30 VITALS — BP 131/71 | HR 125 | Ht 70.0 in | Wt 162.8 lb

## 2022-05-30 DIAGNOSIS — I4711 Inappropriate sinus tachycardia, so stated: Secondary | ICD-10-CM | POA: Diagnosis not present

## 2022-05-30 MED ORDER — PROPRANOLOL HCL 20 MG PO TABS: 20.0000 mg | ORAL_TABLET | Freq: Two times a day (BID) | ORAL | 3 refills | Status: DC | PRN
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
## 2022-05-30 NOTE — Patient Instructions (Signed)
Medication Instructions:  START: PROPANOLOL 20mg  TWICE DAILY AS NEEDED FOR ELEVATED HEART RATES *If you need a refill on your cardiac medications before your next appointment, please call your pharmacy*  Lab Work: None Ordered At This Time.  If you have labs (blood work) drawn today and your tests are completely normal, you will receive your results only by: [place] (if you have MyChart) OR A paper copy in the mail If you have any lab test that is abnormal or we need to change your treatment, we will call you to review the results.  Testing/Procedures: None Ordered At This Time.   Follow-Up: At [facility], you and your health needs are our priority.  As part of our continuing mission to provide you with exceptional heart care, we have created designated Provider Care Teams.  These Care Teams include your primary Cardiologist (physician) and Advanced Practice Providers (APPs -  Physician Assistants and Nurse Practitioners) who all work together to provide you with the care you need, when you need it.  Your next appointment:   6 month(s)  Provider:   Janina Mayo, MD

## 2022-06-12 ENCOUNTER — Encounter: Payer: Self-pay | Admitting: Internal Medicine

## 2022-06-12 NOTE — Telephone Encounter (Signed)
error 

## 2022-07-04 ENCOUNTER — Ambulatory Visit (INDEPENDENT_AMBULATORY_CARE_PROVIDER_SITE_OTHER): Payer: 59 | Admitting: Family Medicine

## 2022-07-04 ENCOUNTER — Encounter: Payer: Self-pay | Admitting: Family Medicine

## 2022-07-04 VITALS — BP 110/62 | HR 86 | Temp 98.6°F | Ht 68.15 in | Wt 163.6 lb

## 2022-07-04 DIAGNOSIS — Z0000 Encounter for general adult medical examination without abnormal findings: Secondary | ICD-10-CM | POA: Diagnosis not present

## 2022-07-04 NOTE — Patient Instructions (Addendum)
No med changes at this time, keep follow up with specialists as planned.  Have a safe trip. Take care!  Preventive Care 7-33 Years Old, Female Preventive care refers to lifestyle choices and visits with your health care provider that can promote health and wellness. Preventive care visits are also called wellness exams. What can I expect for my preventive care visit? Counseling During your preventive care visit, your health care provider may ask about your: Medical history, including: Past medical problems. Family medical history. Pregnancy history. Current health, including: Menstrual cycle. Method of birth control. Emotional well-being. Home life and relationship well-being. Sexual activity and sexual health. Lifestyle, including: Alcohol, nicotine or tobacco, and drug use. Access to firearms. Diet, exercise, and sleep habits. Work and work Statistician. Sunscreen use. Safety issues such as seatbelt and bike helmet use. Physical exam Your health care provider may check your: Height and weight. These may be used to calculate your BMI (body mass index). BMI is a measurement that tells if you are at a healthy weight. Waist circumference. This measures the distance around your waistline. This measurement also tells if you are at a healthy weight and may help predict your risk of certain diseases, such as type 2 diabetes and high blood pressure. Heart rate and blood pressure. Body temperature. Skin for abnormal spots. What immunizations do I need?  Vaccines are usually given at various ages, according to a schedule. Your health care provider will recommend vaccines for you based on your age, medical history, and lifestyle or other factors, such as travel or where you work. What tests do I need? Screening Your health care provider may recommend screening tests for certain conditions. This may include: Pelvic exam and Pap test. Lipid and cholesterol levels. Diabetes screening. This  is done by checking your blood sugar (glucose) after you have not eaten for a while (fasting). Hepatitis B test. Hepatitis C test. HIV (human immunodeficiency virus) test. STI (sexually transmitted infection) testing, if you are at risk. BRCA-related cancer screening. This may be done if you have a family history of breast, ovarian, tubal, or peritoneal cancers. Talk with your health care provider about your test results, treatment options, and if necessary, the need for more tests. Follow these instructions at home: Eating and drinking  Eat a healthy diet that includes fresh fruits and vegetables, whole grains, lean protein, and low-fat dairy products. Take vitamin and mineral supplements as recommended by your health care provider. Do not drink alcohol if: Your health care provider tells you not to drink. You are pregnant, may be pregnant, or are planning to become pregnant. If you drink alcohol: Limit how much you have to 0-1 drink a day. Know how much alcohol is in your drink. In the [place] one drink equals one 12 oz bottle of beer (355 mL), one 5 oz glass of wine (148 mL), or one 1 oz glass of hard liquor (44 mL). Lifestyle Brush your teeth every morning and night with fluoride toothpaste. Floss one time each day. Exercise for at least 30 minutes 5 or more days each week. Do not use any products that contain nicotine or tobacco. These products include cigarettes, chewing tobacco, and vaping devices, such as e-cigarettes. If you need help quitting, ask your health care provider. Do not use drugs. If you are sexually active, practice safe sex. Use a condom or other form of protection to prevent STIs. If you do not wish to become pregnant, use a form of birth control. If you plan  to become pregnant, see your health care provider for a prepregnancy visit. Find healthy ways to manage stress, such as: Meditation, yoga, or listening to music. Journaling. Talking to a trusted  person. Spending time with friends and family. Minimize exposure to UV radiation to reduce your risk of skin cancer. Safety Always wear your seat belt while driving or riding in a vehicle. Do not drive: If you have been drinking alcohol. Do not ride with someone who has been drinking. If you have been using any mind-altering substances or drugs. While texting. When you are tired or distracted. Wear a helmet and other protective equipment during sports activities. If you have firearms in your house, make sure you follow all gun safety procedures. Seek help if you have been physically or sexually abused. What's next? Go to your health care provider once a year for an annual wellness visit. Ask your health care provider how often you should have your eyes and teeth checked. Stay up to date on all vaccines. This information is not intended to replace advice given to you by your health care provider. Make sure you discuss any questions you have with your health care provider. Document Revised: 10/05/2020 Document Reviewed: 10/05/2020 Elsevier Patient Education  [place].

## 2022-07-04 NOTE — Progress Notes (Signed)
Subjective:  Patient ID: Hailey Higgins, female    DOB: 10/06/1990  Age: 32 y.o. MRN: [id number]  CC:  Chief Complaint  Patient presents with   Annual Exam    Pt has eaten     HPI Hailey Higgins presents for Annual Exam, prior establish care visit January 29. 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 Harl Bowie.  Office visit February 7.Treated with propranolol 20 mg twice daily as needed.  Did discuss that Vyvanse can contribute.  72-monthfollow-up planned.  Only occasional need. No new CP/palpitations.   Overactive bladder Followed by urology with ADowney NP.  Visit today.  Treated with Detrol LA 4 mg daily - past 1.5-2 yrs. Some increased activity. Possiblly related to job change. 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   Attention deficit disorder Vyvanse '30mg'$  qd. Takes on work days. New job 5 d/week.. no new side effects. [facility]. Controlled substance database (PDMP) reviewed. No concerns appreciated. Last filled 06/19/22.   Contraceptive counseling On OCP, Junel FE 1/20.  No plans on pregnancy in next year, no se or BTB. Followed by GYN.  UTD on pap testing.      07/04/2022    1:41 PM 05/21/2022    1:41 PM 08/27/2015    2:27 PM  Depression screen PHQ 2/9  Decreased Interest 0 0 0  Down, Depressed, Hopeless 0 0 0  PHQ - 2 Score 0 0 0  Altered sleeping 0 0   Tired, decreased energy 1 0   Change in appetite 0 0   Feeling bad or failure about yourself  0 0   Trouble concentrating 1 1   Moving slowly or fidgety/restless 1 0   Suicidal thoughts 0 0   PHQ-9 Score 3 1   Difficult doing work/chores  Not difficult at all     Health Maintenance  Topic Date Due   PAP SMEAR-Modifier  12/04/2024   INFLUENZA VACCINE  Completed   HPV VACCINES  Aged Out   DTaP/Tdap/Td  Discontinued   COVID-19  Vaccine  Discontinued   Hepatitis C Screening  Discontinued   HIV Screening  Discontinued    Immunization History  Administered Date(s) Administered   Influenza-Unspecified 01/30/2022   PFIZER(Purple Top)SARS-COV-2 Vaccination 06/12/2021   Pfizer Fall 2023 Covid-19 Vaccine [id number]/04/2019, 08/17/2019   Tdap 11/21/2015  Covid booster - deferred.   No results found. Once per year at optho, s/p lasik.   Dental:  Dr. ShaBurnard Buntingt least every 6 months.   Alcohol: 3 per week.   Tobacco:  none.   No IDU.   Exercise: 4-5 times per week at GYM, 66m68m cardio and resistance, hiking and other cardio as well.    History There are no problems to display for this patient.  Past Medical History:  Diagnosis Date   ADHD (attention deficit hyperactivity disorder)    Past Surgical History:  Procedure Laterality Date   CLOSED REDUCTION TIBIAL FRACTURE Left 11/21/2015   EXTERNAL FIXATION ANKLE FRACTURE Left 11/21/2015   distal tibia   IM NAILING TIBIA Left 11/28/2015   INCISION AND DRAINAGE TIBIA Left 11/21/2015   ORIF CALCANEOUS FRACTURE Right 11/28/2015   PERCUTANEOUS PINNING TALUS FRACTURE Right 11/21/2015   talar neck fx.   Allergies  Allergen Reactions   Penicillin G Nausea And Vomiting   Prior to Admission  medications   Medication Sig Start Date End Date Taking? Authorizing Provider  JUNEL FE 1/20 1-20 MG-MCG tablet Take 1 tablet by mouth daily. 05/26/22  Yes [provider]  lisdexamfetamine (VYVANSE) 30 MG capsule Take 30 mg by mouth daily.   Yes [provider]  propranolol (INDERAL) 20 MG tablet Take 1 tablet (20 mg total) by mouth 2 (two) times daily as needed (for elevated heart rates). 05/30/22  Yes BranchRoyetta Crochet, MD  tolterodine (DETROL LA) 4 MG 24 hr capsule Take 4 mg by mouth daily.   Yes [provider]   Social History   Socioeconomic History   Marital status: Single    Spouse name: Not on file   Number of children: Not on  file   Years of education: Not on file   Highest education level: Not on file  Occupational History   Not on file  Tobacco Use   Smoking status: Never   Smokeless tobacco: Never  Vaping Use   Vaping Use: Never used  Substance and Sexual Activity   Alcohol use: Yes    Comment: rare   Drug use: No   Sexual activity: Yes    Birth control/protection: Pill  Other Topics Concern   Not on file  Social History Narrative   Not on file   Social Determinants of Health   Financial Resource Strain: Not on file  Food Insecurity: Not on file  Transportation Needs: Not on file  Physical Activity: Not on file  Stress: Not on file  Social Connections: Not on file  Intimate Partner Violence: Not on file    Review of Systems  13 point review of systems per patient health survey noted.  Negative other than as indicated above or in HPI.   Objective:   Vitals:   07/04/22 1341  BP: 110/62  Pulse: 86  Temp: 98.6 F (37 C)  TempSrc: Temporal  SpO2: 100%  Weight: 163 lb 9.6 oz (74.2 kg)  Height: 5' 8.15" (1.731 m)     Physical Exam Vitals reviewed.  Constitutional:      Appearance: She is well-developed.  HENT:     Head: Normocephalic and atraumatic.     Right Ear: External ear normal.     Left Ear: External ear normal.  Eyes:     Conjunctiva/sclera: Conjunctivae normal.     Pupils: Pupils are equal, round, and reactive to light.  Neck:     Thyroid: No thyromegaly.  Cardiovascular:     Rate and Rhythm: Normal rate and regular rhythm.     Heart sounds: Normal heart sounds. No murmur heard. Pulmonary:     Effort: Pulmonary effort is normal. No respiratory distress.     Breath sounds: Normal breath sounds. No wheezing.  Abdominal:     General: Bowel sounds are normal.     Palpations: Abdomen is soft.     Tenderness: There is no abdominal tenderness.  Musculoskeletal:        General: No tenderness. Normal range of motion.     Cervical back: Normal range of motion and neck  supple.  Lymphadenopathy:     Cervical: No cervical adenopathy.  Skin:    General: Skin is warm and dry.     Findings: No rash.  Neurological:     Mental Status: She is alert and oriented to person, place, and time.  Psychiatric:        Behavior: Behavior normal.        Thought Content: Thought  content normal.        Assessment & Plan:  Krina Barylski is a 32 y.o. female . Annual physical exam -anticipatory guidance as below in AVS, screening labs above. Health maintenance items as above in HPI discussed/recommended as applicable.  Continue follow-up with specialist regarding ADD, overactive bladder, tachycardia.  No med changes.  Current contraceptive stable.  No changes.  Labs declined today.  Recheck 1 year for physical, consider fasting lipids, CMP, other labs at that time but recent labs were reassuring.  No orders of the defined types were placed in this encounter.  Patient Instructions  No med changes at this time, keep follow up with specialists as planned.  Have a safe trip. Take care!  Preventive Care 57-44 Years Old, Female Preventive care refers to lifestyle choices and visits with your health care provider that can promote health and wellness. Preventive care visits are also called wellness exams. What can I expect for my preventive care visit? Counseling During your preventive care visit, your health care provider may ask about your: Medical history, including: Past medical problems. Family medical history. Pregnancy history. Current health, including: Menstrual cycle. Method of birth control. Emotional well-being. Home life and relationship well-being. Sexual activity and sexual health. Lifestyle, including: Alcohol, nicotine or tobacco, and drug use. Access to firearms. Diet, exercise, and sleep habits. Work and work Statistician. Sunscreen use. Safety issues such as seatbelt and bike helmet use. Physical exam Your health care provider may check  your: Height and weight. These may be used to calculate your BMI (body mass index). BMI is a measurement that tells if you are at a healthy weight. Waist circumference. This measures the distance around your waistline. This measurement also tells if you are at a healthy weight and may help predict your risk of certain diseases, such as type 2 diabetes and high blood pressure. Heart rate and blood pressure. Body temperature. Skin for abnormal spots. What immunizations do I need?  Vaccines are usually given at various ages, according to a schedule. Your health care provider will recommend vaccines for you based on your age, medical history, and lifestyle or other factors, such as travel or where you work. What tests do I need? Screening Your health care provider may recommend screening tests for certain conditions. This may include: Pelvic exam and Pap test. Lipid and cholesterol levels. Diabetes screening. This is done by checking your blood sugar (glucose) after you have not eaten for a while (fasting). Hepatitis B test. Hepatitis C test. HIV (human immunodeficiency virus) test. STI (sexually transmitted infection) testing, if you are at risk. BRCA-related cancer screening. This may be done if you have a family history of breast, ovarian, tubal, or peritoneal cancers. Talk with your health care provider about your test results, treatment options, and if necessary, the need for more tests. Follow these instructions at home: Eating and drinking  Eat a healthy diet that includes fresh fruits and vegetables, whole grains, lean protein, and low-fat dairy products. Take vitamin and mineral supplements as recommended by your health care provider. Do not drink alcohol if: Your health care provider tells you not to drink. You are pregnant, may be pregnant, or are planning to become pregnant. If you drink alcohol: Limit how much you have to 0-1 drink a day. Know how much alcohol is in your  drink. In the [place] one drink equals one 12 oz bottle of beer (355 mL), one 5 oz glass of wine (148 mL), or one 1 oz glass  of hard liquor (44 mL). Lifestyle Brush your teeth every morning and night with fluoride toothpaste. Floss one time each day. Exercise for at least 30 minutes 5 or more days each week. Do not use any products that contain nicotine or tobacco. These products include cigarettes, chewing tobacco, and vaping devices, such as e-cigarettes. If you need help quitting, ask your health care provider. Do not use drugs. If you are sexually active, practice safe sex. Use a condom or other form of protection to prevent STIs. If you do not wish to become pregnant, use a form of birth control. If you plan to become pregnant, see your health care provider for a prepregnancy visit. Find healthy ways to manage stress, such as: Meditation, yoga, or listening to music. Journaling. Talking to a trusted person. Spending time with friends and family. Minimize exposure to UV radiation to reduce your risk of skin cancer. Safety Always wear your seat belt while driving or riding in a vehicle. Do not drive: If you have been drinking alcohol. Do not ride with someone who has been drinking. If you have been using any mind-altering substances or drugs. While texting. When you are tired or distracted. Wear a helmet and other protective equipment during sports activities. If you have firearms in your house, make sure you follow all gun safety procedures. Seek help if you have been physically or sexually abused. What's next? Go to your health care provider once a year for an annual wellness visit. Ask your health care provider how often you should have your eyes and teeth checked. Stay up to date on all vaccines. This information is not intended to replace advice given to you by your health care provider. Make sure you discuss any questions you have with your health care provider. Document Revised:  10/05/2020 Document Reviewed: 10/05/2020 Elsevier Patient Education  [place],   Merri Ray, MD [place] 07/04/22 2:19 PM

## 2022-11-28 ENCOUNTER — Ambulatory Visit: Payer: 59 | Admitting: Internal Medicine

## 2023-07-05 ENCOUNTER — Ambulatory Visit: Payer: PRIVATE HEALTH INSURANCE | Admitting: Family Medicine

## 2023-07-05 VITALS — BP 116/64 | HR 96 | Temp 97.9°F | Ht 68.75 in | Wt 159.4 lb

## 2023-07-05 DIAGNOSIS — Z0001 Encounter for general adult medical examination with abnormal findings: Secondary | ICD-10-CM

## 2023-07-05 DIAGNOSIS — Z131 Encounter for screening for diabetes mellitus: Secondary | ICD-10-CM

## 2023-07-05 DIAGNOSIS — Z13220 Encounter for screening for lipoid disorders: Secondary | ICD-10-CM

## 2023-07-05 DIAGNOSIS — R519 Headache, unspecified: Secondary | ICD-10-CM

## 2023-07-05 DIAGNOSIS — Z130 Encounter for screening for diseases of the blood and blood-forming organs and certain disorders involving the immune mechanism: Secondary | ICD-10-CM

## 2023-07-05 DIAGNOSIS — Z1329 Encounter for screening for other suspected endocrine disorder: Secondary | ICD-10-CM

## 2023-07-05 DIAGNOSIS — N3281 Overactive bladder: Secondary | ICD-10-CM | POA: Diagnosis not present

## 2023-07-05 DIAGNOSIS — Z0000 Encounter for general adult medical examination without abnormal findings: Secondary | ICD-10-CM

## 2023-07-05 DIAGNOSIS — G43009 Migraine without aura, not intractable, without status migrainosus: Secondary | ICD-10-CM

## 2023-07-05 DIAGNOSIS — Z7184 Encounter for health counseling related to travel: Secondary | ICD-10-CM | POA: Diagnosis not present

## 2023-07-05 MED ORDER — ONDANSETRON HCL 4 MG PO TABS: 4.0000 mg | ORAL_TABLET | Freq: Three times a day (TID) | ORAL | 0 refills | Status: AC | PRN

## 2023-07-05 MED ORDER — PROPRANOLOL HCL 20 MG PO TABS: 20.0000 mg | ORAL_TABLET | Freq: Two times a day (BID) | ORAL | 3 refills | Status: DC | PRN

## 2023-07-05 NOTE — Patient Instructions (Addendum)
 Try restarting propranolol for the heart rate as well as possible prevention of migraine headaches.  I would consider a triptan medication for migraines but would prefer you discuss that first with cardiology given your previous heart rate and cardiac history.  See other information below on prevention of headaches.  For travel - follow recommendations on CDC.gov for locations planned.  Covid booster in the fall with flu vaccine.  Last typhoid in 10/04/21 - planned repeat in 5 yrs.  Any other vaccines can be discussed at [facility] dept.  Zofran if needed for nausea with migraine or with travel.   Thanks for coming in today.   Migraine Headache A migraine headache is an intense pulsing or throbbing pain on one or both sides of the head. Migraine headaches may also cause other symptoms, such as nausea, vomiting, and sensitivity to light and noise. A migraine headache can last from 4 hours to 3 days. Talk with your health care provider about what things may bring on (trigger) your migraine headaches. What are the causes? The exact cause is not known. However, a migraine may be caused when nerves in the brain get irritated and release chemicals that cause blood vessels to become inflamed. This inflammation causes pain. Migraines may be triggered or caused by: Smoking. Medicines, such as: Nitroglycerin, which is used to treat chest pain. Birth control pills. Estrogen. Certain blood pressure medicines. Foods or drinks that contain nitrates, glutamate, aspartame, MSG, or tyramine. Certain foods or drinks, such as aged cheeses, chocolate, alcohol, or caffeine. Doing physical activity that is very hard. Other triggers may include: Menstruation. Pregnancy. Hunger. Stress. Getting too much or too little sleep. Weather changes. Tiredness (fatigue). What increases the risk? The following factors may make you more likely to have migraine headaches: Being between the ages of 36-16 years old. Being  female. Having a family history of migraine headaches. Being Caucasian. Having a mental health condition, such as depression or anxiety. Being obese. What are the signs or symptoms? The main symptom of this condition is pulsing or throbbing pain. This pain may: Happen in any area of the head, such as on one or both sides. Make it hard to do daily activities. Get worse with physical activity. Get worse around bright lights, loud noises, or smells. Other symptoms may include: Nausea. Vomiting. Dizziness. Before a migraine headache starts, you may get warning signs (an aura). An aura may include: Seeing flashing lights or having blind spots. Seeing bright spots, halos, or zigzag lines. Having tunnel vision or blurred vision. Having numbness or a tingling feeling. Having trouble talking. Having muscle weakness. After a migraine ends, you may have symptoms. These may include: Feeling tired. Trouble concentrating. How is this diagnosed? A migraine headache can be diagnosed based on: Your symptoms. A physical exam. Tests, such as: A CT scan or an MRI of the head. These tests can help rule out other causes of headaches. Taking fluid from the spine (lumbar puncture) to examine it (cerebrospinal fluid analysis, or CSF analysis). How is this treated? This condition may be treated with medicines that: Relieve pain and nausea. Prevent migraines. Treatment may also include: Acupuncture. Lifestyle changes like avoiding foods that trigger migraine headaches. Learning ways to control your body (biofeedback). Talk therapy to help you know and deal with negative thoughts (cognitive behavioral therapy). Follow these instructions at home: Medicines Take over-the-counter and prescription medicines only as told by your provider. Ask your provider if the medicine prescribed to you: Requires you to avoid driving  or using machinery. Can cause constipation. You may need to take these actions to  prevent or treat constipation: Drink enough fluid to keep your pee (urine) pale yellow. Take over-the-counter or prescription medicines. Eat foods that are high in fiber, such as beans, whole grains, and fresh fruits and vegetables. Limit foods that are high in fat and processed sugars, such as fried or sweet foods. Lifestyle  Do not drink alcohol. Do not use any products that contain nicotine or tobacco. These products include cigarettes, chewing tobacco, and vaping devices, such as e-cigarettes. If you need help quitting, ask your provider. Get 7-9 hours of sleep each night, or the amount recommended by your provider. Find ways to manage stress, such as meditation, deep breathing, or yoga. Try to exercise regularly. This can help lessen how bad and how often your migraines occur. General instructions Keep a journal to find out what triggers your migraines, so you can avoid those things. For example, write down: What you eat and drink. How much sleep you get. Any change to your diet or medicines. If you have a migraine headache: Avoid things that make your symptoms worse, such as bright lights. Lie down in a dark, quiet room. Do not drive or use machinery. Ask your provider what activities are safe for you while you have symptoms. Keep all follow-up visits. Your provider will monitor your symptoms and recommend any further treatment. Where to find more information Coalition for Headache and Migraine Patients (CHAMP): headachemigraine.org American Migraine Foundation: americanmigrainefoundation.org National Headache Foundation: headaches.org Contact a health care provider if: You have symptoms that are different or worse than your usual migraine headache symptoms. You have more than 15 days of headaches in one month. Get help right away if: Your migraine headache becomes severe or lasts more than 72 hours. You have a fever or stiff neck. You have vision loss. Your muscles feel weak  or like you cannot control them. You lose your balance often or have trouble walking. You faint. You have a seizure. This information is not intended to replace advice given to you by your health care provider. Make sure you discuss any questions you have with your health care provider. Document Revised: 12/04/2021 Document Reviewed: 12/04/2021 Elsevier Patient Education  2024 [employer].  Preventive Care 79-58 Years Old, Female Preventive care refers to lifestyle choices and visits with your health care provider that can promote health and wellness. Preventive care visits are also called wellness exams. What can I expect for my preventive care visit? Counseling During your preventive care visit, your health care provider may ask about your: Medical history, including: Past medical problems. Family medical history. Pregnancy history. Current health, including: Menstrual cycle. Method of birth control. Emotional well-being. Home life and relationship well-being. Sexual activity and sexual health. Lifestyle, including: Alcohol, nicotine or tobacco, and drug use. Access to firearms. Diet, exercise, and sleep habits. Work and work Astronomer. Sunscreen use. Safety issues such as seatbelt and bike helmet use. Physical exam Your health care provider may check your: Height and weight. These may be used to calculate your BMI (body mass index). BMI is a measurement that tells if you are at a healthy weight. Waist circumference. This measures the distance around your waistline. This measurement also tells if you are at a healthy weight and may help predict your risk of certain diseases, such as type 2 diabetes and high blood pressure. Heart rate and blood pressure. Body temperature. Skin for abnormal spots. What immunizations do I need?  Vaccines are usually given at various ages, according to a schedule. Your health care provider will recommend vaccines for you based on your age,  medical history, and lifestyle or other factors, such as travel or where you work. What tests do I need? Screening Your health care provider may recommend screening tests for certain conditions. This may include: Pelvic exam and Pap test. Lipid and cholesterol levels. Diabetes screening. This is done by checking your blood sugar (glucose) after you have not eaten for a while (fasting). Hepatitis B test. Hepatitis C test. HIV (human immunodeficiency virus) test. STI (sexually transmitted infection) testing, if you are at risk. BRCA-related cancer screening. This may be done if you have a family history of breast, ovarian, tubal, or peritoneal cancers. Talk with your health care provider about your test results, treatment options, and if necessary, the need for more tests. Follow these instructions at home: Eating and drinking  Eat a healthy diet that includes fresh fruits and vegetables, whole grains, lean protein, and low-fat dairy products. Take vitamin and mineral supplements as recommended by your health care provider. Do not drink alcohol if: Your health care provider tells you not to drink. You are pregnant, may be pregnant, or are planning to become pregnant. If you drink alcohol: Limit how much you have to 0-1 drink a day. Know how much alcohol is in your drink. In the [place] one drink equals one 12 oz bottle of beer (355 mL), one 5 oz glass of wine (148 mL), or one 1 oz glass of hard liquor (44 mL). Lifestyle Brush your teeth every morning and night with fluoride toothpaste. Floss one time each day. Exercise for at least 30 minutes 5 or more days each week. Do not use any products that contain nicotine or tobacco. These products include cigarettes, chewing tobacco, and vaping devices, such as e-cigarettes. If you need help quitting, ask your health care provider. Do not use drugs. If you are sexually active, practice safe sex. Use a condom or other form of protection to prevent  STIs. If you do not wish to become pregnant, use a form of birth control. If you plan to become pregnant, see your health care provider for a prepregnancy visit. Find healthy ways to manage stress, such as: Meditation, yoga, or listening to music. Journaling. Talking to a trusted person. Spending time with friends and family. Minimize exposure to UV radiation to reduce your risk of skin cancer. Safety Always wear your seat belt while driving or riding in a vehicle. Do not drive: If you have been drinking alcohol. Do not ride with someone who has been drinking. If you have been using any mind-altering substances or drugs. While texting. When you are tired or distracted. Wear a helmet and other protective equipment during sports activities. If you have firearms in your house, make sure you follow all gun safety procedures. Seek help if you have been physically or sexually abused. What's next? Go to your health care provider once a year for an annual wellness visit. Ask your health care provider how often you should have your eyes and teeth checked. Stay up to date on all vaccines. This information is not intended to replace advice given to you by your health care provider. Make sure you discuss any questions you have with your health care provider. Document Revised: 10/05/2020 Document Reviewed: 10/05/2020 Elsevier Patient Education  2024 [employer].

## 2023-07-05 NOTE — Progress Notes (Signed)
 Subjective:  Patient ID: Hailey Higgins, female    DOB: 06/21/1990  Age: 33 y.o. MRN: [id number]  CC:  Chief Complaint  Patient presents with   Annual Exam    Pt is doing well, notes she is traveling and would like to know if she needs vaccines for travel to [place] and [place], note would also like to potentially discuss getting migraine medication if that's an option    HPI Hailey Higgins presents for Annual Exam and other concerns above including travel counseling and headaches.   PCP, me Urology, Dr. Logan Bores with [facility].  Overactive bladder treated with Detrol LA.  Office visit in January, concerned about possible constipation from Detrol LA.  Plan to try Trospium, 20 mg twice daily.  Option of 60 mg extended release version, depending on cost.  If not responding, consider switching to or adding beta 3 agonist like Myrbetriq or Gemtesa, then advanced therapies such as sacral neuromodulation or chemodenervation.  3-4 month follow-up planned. Appt soon - next month. Doing better with trospium. Occasional missed evening dose.  OB/GYN, Tish Frederickson at [place].  On Junel for OCP. Working well, appt last month. No BTB. No plans on pregnancy in next year.  Cardiology, Dr. Wyline Mood.  History of inappropriate sinus tachycardia, propranolol 20 mg twice daily as needed, possible contribution of her Vyvanse.  Rare need for propranolol - not needed in months. Cancelled follow up with cardiology last year. Improved after breakup with BF. Has been doing well with therapy.  Still occasional fast heart rate - noted last week at bedtime. 20 seconds, no associated CP/dyspnea/dizziness. Fast HR, pounding in chest only. Propanolol has been tolerated BID, no side effects when taken.  Attention deficit specialist, Dr. Elisabeth Most at [facility], has been followed by Thayer Ohm recently, Vyvanse 30 mg daily for ADD. Working well.   Home health PT. Work is going well.    Headaches: New concern as above. HA's for decades. Unknown if migraine diagnosis. Improved overall, but debilitating when occurs. Photo and phonophobia, nausea without vomiting. Occurs once per month. Bitemporal or occipital. Treated with tylenol - min relief. Usually has to sleep in dark place. No prior triptan.   Travel counseling She will be traveling to [place], [place], [place] in December, possibly [place].  She researched recommendations on CDC.   Immunization History  Administered Date(s) Administered   Influenza-Unspecified 01/30/2022, 01/19/2023   PFIZER(Purple Top)SARS-COV-2 Vaccination 06/12/2021   Pfizer Fall 2023 Covid-19 Vaccine 6mos thru 13yrs  07/23/2019, 08/17/2019   Tdap 11/21/2015  [facility] reviewed.  Covid and flu vaccine in fall planned.  MMRx2. Hep A and Hep B prior. Declines varicella titer.  Tdap in 2017 Typhoid- 2017 and 10/04/2021 - repeat 5 yrs.  Malaria - not listed on countries on review today - she will verify and let me know if other countries where this is recommended. .  Requests zofran for travel if needed.      07/05/2023    9:02 AM 07/04/2022    1:41 PM 05/21/2022    1:41 PM 08/27/2015    2:27 PM  Depression screen PHQ 2/9  Decreased Interest 0 0 0 0  Down, Depressed, Hopeless 0 0 0 0  PHQ - 2 Score 0 0 0 0  Altered sleeping 0 0 0   Tired, decreased energy 0 1 0   Change in appetite 0 0 0   Feeling bad or failure about yourself  0 0 0   Trouble concentrating 0  1 1   Moving slowly or fidgety/restless 0 1 0   Suicidal thoughts 0 0 0   PHQ-9 Score 0 3 1   Difficult doing work/chores   Not difficult at all     Health Maintenance  Topic Date Due   Cervical Cancer Screening (HPV/Pap Cotest)  12/05/2026   INFLUENZA VACCINE  Completed   HPV VACCINES  Aged Out   DTaP/Tdap/Td  Discontinued   COVID-19 Vaccine  Discontinued   Hepatitis C Screening  Discontinued   HIV Screening  Discontinued  Pap testing with gyn - up to date as above.    Immunization History  Administered Date(s) Administered   Influenza-Unspecified 01/30/2022, 01/19/2023   PFIZER(Purple Top)SARS-COV-2 Vaccination 06/12/2021   Pfizer Fall 2023 Covid-19 Vaccine 6mos thru 1yrs  07/23/2019, 08/17/2019   Tdap 11/21/2015  Vaccines as above with travel.   No results found.  Followed yearly by ophthalmology with history of LASEK. Doing ok.   Dental: Dr. Everardo Beals, 28-month follow-ups.  Alcohol: 3/week, sometimes less.   Tobacco: No tobacco, no vaping, no IDU.  Exercise: Gym exercise 4-5 times per week with both cardio and resistance exercise, hiking and other cardio exercise.   History There are no active problems to display for this patient.  Past Medical History:  Diagnosis Date   ADHD (attention deficit hyperactivity disorder)    Past Surgical History:  Procedure Laterality Date   CLOSED REDUCTION TIBIAL FRACTURE Left 11/21/2015   EXTERNAL FIXATION ANKLE FRACTURE Left 11/21/2015   distal tibia   IM NAILING TIBIA Left 11/28/2015   INCISION AND DRAINAGE TIBIA Left 11/21/2015   ORIF CALCANEOUS FRACTURE Right 11/28/2015   PERCUTANEOUS PINNING TALUS FRACTURE Right 11/21/2015   talar neck fx.   Allergies  Allergen Reactions   Penicillin G Nausea And Vomiting   Prior to Admission medications   Medication Sig Start Date End Date Taking? Authorizing Provider  JUNEL FE 1/20 1-20 MG-MCG tablet Take 1 tablet by mouth daily. 05/26/22   [provider]  lisdexamfetamine (VYVANSE) 30 MG capsule Take 30 mg by mouth daily.    [provider]  propranolol (INDERAL) 20 MG tablet Take 1 tablet (20 mg total) by mouth 2 (two) times daily as needed (for elevated heart rates). 05/30/22   Maisie Fus, MD   Social History   Socioeconomic History   Marital status: Single    Spouse name: Not on file   Number of children: Not on file   Years of education: Not on file   Highest education level: Not on file  Occupational History   Not on file   Tobacco Use   Smoking status: Never   Smokeless tobacco: Never  Vaping Use   Vaping status: Never Used  Substance and Sexual Activity   Alcohol use: Yes    Comment: rare   Drug use: No   Sexual activity: Yes    Birth control/protection: Pill  Other Topics Concern   Not on file  Social History Narrative   Not on file   Social Drivers of Health   Financial Resource Strain: Not on file  Food Insecurity: Low Risk  (04/30/2023)   Received from [facility]   Hunger Vital Sign    Worried About Running Out of Food in the Last Year: Never true    Ran Out of Food in the Last Year: Never true  Transportation Needs: No Transportation Needs (04/30/2023)   Received from [facility]    In the past 12  months, has lack of reliable transportation kept you from medical appointments, meetings, work or from getting things needed for daily living? : No  Physical Activity: Not on file  Stress: Not on file  Social Connections: Not on file  Intimate Partner Violence: Unknown (08/19/2021)   [facility]   Intimate Partner Violence    Fear of Current or Ex-Partner: Not on file    Review of Systems 13 point review of systems per patient health survey noted.  Negative other than as indicated above or in HPI.    Objective:   Vitals:   07/05/23 0902  BP: 116/64  Pulse: 96  Temp: 97.9 F (36.6 C)  TempSrc: Temporal  SpO2: 99%  Weight: 159 lb 6.4 oz (72.3 kg)  Height: 5' 8.75" (1.746 m)     Physical Exam Vitals reviewed.  Constitutional:      Appearance: She is well-developed.  HENT:     Head: Normocephalic and atraumatic.     Right Ear: External ear normal.     Left Ear: External ear normal.  Eyes:     Conjunctiva/sclera: Conjunctivae normal.     Pupils: Pupils are equal, round, and reactive to light.  Neck:     Thyroid: No thyromegaly.  Cardiovascular:     Rate and Rhythm: Normal rate and regular rhythm.     Heart sounds: Normal  heart sounds. No murmur heard. Pulmonary:     Effort: Pulmonary effort is normal. No respiratory distress.     Breath sounds: Normal breath sounds. No wheezing.  Abdominal:     General: Bowel sounds are normal.     Palpations: Abdomen is soft.     Tenderness: There is no abdominal tenderness.  Musculoskeletal:        General: No tenderness. Normal range of motion.     Cervical back: Normal range of motion and neck supple.  Lymphadenopathy:     Cervical: No cervical adenopathy.  Skin:    General: Skin is warm and dry.     Findings: No rash.  Neurological:     Mental Status: She is alert and oriented to person, place, and time.  Psychiatric:        Behavior: Behavior normal.        Thought Content: Thought content normal.        Assessment & Plan:  Hailey Higgins is a 33 y.o. female . Annual physical exam  - -anticipatory guidance as below in AVS, screening labs above. Health maintenance items as above in HPI discussed/recommended as applicable.   Overactive bladder  -Followed by urology as above, continue same regimen with urology follow-up.  Nonintractable headache, unspecified chronicity pattern, unspecified headache type Migraine without aura and without status migrainosus, not intractable - Plan: ondansetron (ZOFRAN) 4 MG tablet, propranolol (INDERAL) 20 MG tablet  -Increased headache as above.  Given her arrhythmia history decided against triptan at this time.  Will start with propranolol as that may lessen frequency of headaches as preventative as well as manage prior palpitations, history of inappropriate sinus tachycardia.  Counseling about travel - Plan: ondansetron (ZOFRAN) 4 MG tablet   -Various recommendations as above from CDC.  Zofran given if needed for nausea/vomiting/gastroenteritis.  Vaccines and nonvaccine preventable illness discussion per [facility] website.  Meds ordered this encounter  Medications   ondansetron (ZOFRAN) 4 MG tablet    Sig: Take 1 tablet (4 mg  total) by mouth every 8 (eight) hours as needed for nausea or vomiting.  Dispense:  20 tablet    Refill:  0   propranolol (INDERAL) 20 MG tablet    Sig: Take 1 tablet (20 mg total) by mouth 2 (two) times daily as needed (for elevated heart rates).    Dispense:  60 tablet    Refill:  3   Patient Instructions  Try restarting propranolol for the heart rate as well as possible prevention of migraine headaches.  I would consider a triptan medication for migraines but would prefer you discuss that first with cardiology given your previous heart rate and cardiac history.  See other information below on prevention of headaches.  For travel - follow recommendations on CDC.gov for locations planned.  Covid booster in the fall with flu vaccine.  Last typhoid in 10/04/21 - planned repeat in 5 yrs.  Any other vaccines can be discussed at [facility] dept.  Zofran if needed for nausea with migraine or with travel.   Thanks for coming in today.   Migraine Headache A migraine headache is an intense pulsing or throbbing pain on one or both sides of the head. Migraine headaches may also cause other symptoms, such as nausea, vomiting, and sensitivity to light and noise. A migraine headache can last from 4 hours to 3 days. Talk with your health care provider about what things may bring on (trigger) your migraine headaches. What are the causes? The exact cause is not known. However, a migraine may be caused when nerves in the brain get irritated and release chemicals that cause blood vessels to become inflamed. This inflammation causes pain. Migraines may be triggered or caused by: Smoking. Medicines, such as: Nitroglycerin, which is used to treat chest pain. Birth control pills. Estrogen. Certain blood pressure medicines. Foods or drinks that contain nitrates, glutamate, aspartame, MSG, or tyramine. Certain foods or drinks, such as aged cheeses, chocolate, alcohol, or caffeine. Doing physical activity that is  very hard. Other triggers may include: Menstruation. Pregnancy. Hunger. Stress. Getting too much or too little sleep. Weather changes. Tiredness (fatigue). What increases the risk? The following factors may make you more likely to have migraine headaches: Being between the ages of 41-56 years old. Being female. Having a family history of migraine headaches. Being Caucasian. Having a mental health condition, such as depression or anxiety. Being obese. What are the signs or symptoms? The main symptom of this condition is pulsing or throbbing pain. This pain may: Happen in any area of the head, such as on one or both sides. Make it hard to do daily activities. Get worse with physical activity. Get worse around bright lights, loud noises, or smells. Other symptoms may include: Nausea. Vomiting. Dizziness. Before a migraine headache starts, you may get warning signs (an aura). An aura may include: Seeing flashing lights or having blind spots. Seeing bright spots, halos, or zigzag lines. Having tunnel vision or blurred vision. Having numbness or a tingling feeling. Having trouble talking. Having muscle weakness. After a migraine ends, you may have symptoms. These may include: Feeling tired. Trouble concentrating. How is this diagnosed? A migraine headache can be diagnosed based on: Your symptoms. A physical exam. Tests, such as: A CT scan or an MRI of the head. These tests can help rule out other causes of headaches. Taking fluid from the spine (lumbar puncture) to examine it (cerebrospinal fluid analysis, or CSF analysis). How is this treated? This condition may be treated with medicines that: Relieve pain and nausea. Prevent migraines. Treatment may also include: Acupuncture. Lifestyle changes like avoiding  foods that trigger migraine headaches. Learning ways to control your body (biofeedback). Talk therapy to help you know and deal with negative thoughts (cognitive  behavioral therapy). Follow these instructions at home: Medicines Take over-the-counter and prescription medicines only as told by your provider. Ask your provider if the medicine prescribed to you: Requires you to avoid driving or using machinery. Can cause constipation. You may need to take these actions to prevent or treat constipation: Drink enough fluid to keep your pee (urine) pale yellow. Take over-the-counter or prescription medicines. Eat foods that are high in fiber, such as beans, whole grains, and fresh fruits and vegetables. Limit foods that are high in fat and processed sugars, such as fried or sweet foods. Lifestyle  Do not drink alcohol. Do not use any products that contain nicotine or tobacco. These products include cigarettes, chewing tobacco, and vaping devices, such as e-cigarettes. If you need help quitting, ask your provider. Get 7-9 hours of sleep each night, or the amount recommended by your provider. Find ways to manage stress, such as meditation, deep breathing, or yoga. Try to exercise regularly. This can help lessen how bad and how often your migraines occur. General instructions Keep a journal to find out what triggers your migraines, so you can avoid those things. For example, write down: What you eat and drink. How much sleep you get. Any change to your diet or medicines. If you have a migraine headache: Avoid things that make your symptoms worse, such as bright lights. Lie down in a dark, quiet room. Do not drive or use machinery. Ask your provider what activities are safe for you while you have symptoms. Keep all follow-up visits. Your provider will monitor your symptoms and recommend any further treatment. Where to find more information Coalition for Headache and Migraine Patients (CHAMP): headachemigraine.org American Migraine Foundation: americanmigrainefoundation.org National Headache Foundation: headaches.org Contact a health care provider  if: You have symptoms that are different or worse than your usual migraine headache symptoms. You have more than 15 days of headaches in one month. Get help right away if: Your migraine headache becomes severe or lasts more than 72 hours. You have a fever or stiff neck. You have vision loss. Your muscles feel weak or like you cannot control them. You lose your balance often or have trouble walking. You faint. You have a seizure. This information is not intended to replace advice given to you by your health care provider. Make sure you discuss any questions you have with your health care provider. Document Revised: 12/04/2021 Document Reviewed: 12/04/2021 Elsevier Patient Education  2024 [employer].  Preventive Care 82-72 Years Old, Female Preventive care refers to lifestyle choices and visits with your health care provider that can promote health and wellness. Preventive care visits are also called wellness exams. What can I expect for my preventive care visit? Counseling During your preventive care visit, your health care provider may ask about your: Medical history, including: Past medical problems. Family medical history. Pregnancy history. Current health, including: Menstrual cycle. Method of birth control. Emotional well-being. Home life and relationship well-being. Sexual activity and sexual health. Lifestyle, including: Alcohol, nicotine or tobacco, and drug use. Access to firearms. Diet, exercise, and sleep habits. Work and work Astronomer. Sunscreen use. Safety issues such as seatbelt and bike helmet use. Physical exam Your health care provider may check your: Height and weight. These may be used to calculate your BMI (body mass index). BMI is a measurement that tells if you are at  a healthy weight. Waist circumference. This measures the distance around your waistline. This measurement also tells if you are at a healthy weight and may help predict your risk of  certain diseases, such as type 2 diabetes and high blood pressure. Heart rate and blood pressure. Body temperature. Skin for abnormal spots. What immunizations do I need?  Vaccines are usually given at various ages, according to a schedule. Your health care provider will recommend vaccines for you based on your age, medical history, and lifestyle or other factors, such as travel or where you work. What tests do I need? Screening Your health care provider may recommend screening tests for certain conditions. This may include: Pelvic exam and Pap test. Lipid and cholesterol levels. Diabetes screening. This is done by checking your blood sugar (glucose) after you have not eaten for a while (fasting). Hepatitis B test. Hepatitis C test. HIV (human immunodeficiency virus) test. STI (sexually transmitted infection) testing, if you are at risk. BRCA-related cancer screening. This may be done if you have a family history of breast, ovarian, tubal, or peritoneal cancers. Talk with your health care provider about your test results, treatment options, and if necessary, the need for more tests. Follow these instructions at home: Eating and drinking  Eat a healthy diet that includes fresh fruits and vegetables, whole grains, lean protein, and low-fat dairy products. Take vitamin and mineral supplements as recommended by your health care provider. Do not drink alcohol if: Your health care provider tells you not to drink. You are pregnant, may be pregnant, or are planning to become pregnant. If you drink alcohol: Limit how much you have to 0-1 drink a day. Know how much alcohol is in your drink. In the [place] one drink equals one 12 oz bottle of beer (355 mL), one 5 oz glass of wine (148 mL), or one 1 oz glass of hard liquor (44 mL). Lifestyle Brush your teeth every morning and night with fluoride toothpaste. Floss one time each day. Exercise for at least 30 minutes 5 or more days each week. Do not  use any products that contain nicotine or tobacco. These products include cigarettes, chewing tobacco, and vaping devices, such as e-cigarettes. If you need help quitting, ask your health care provider. Do not use drugs. If you are sexually active, practice safe sex. Use a condom or other form of protection to prevent STIs. If you do not wish to become pregnant, use a form of birth control. If you plan to become pregnant, see your health care provider for a prepregnancy visit. Find healthy ways to manage stress, such as: Meditation, yoga, or listening to music. Journaling. Talking to a trusted person. Spending time with friends and family. Minimize exposure to UV radiation to reduce your risk of skin cancer. Safety Always wear your seat belt while driving or riding in a vehicle. Do not drive: If you have been drinking alcohol. Do not ride with someone who has been drinking. If you have been using any mind-altering substances or drugs. While texting. When you are tired or distracted. Wear a helmet and other protective equipment during sports activities. If you have firearms in your house, make sure you follow all gun safety procedures. Seek help if you have been physically or sexually abused. What's next? Go to your health care provider once a year for an annual wellness visit. Ask your health care provider how often you should have your eyes and teeth checked. Stay up to date on all vaccines. This  information is not intended to replace advice given to you by your health care provider. Make sure you discuss any questions you have with your health care provider. Document Revised: 10/05/2020 Document Reviewed: 10/05/2020 Elsevier Patient Education  2024 Elsevier Inc.     Signed,   Meredith Staggers, MD [facility], [facility] 07/05/23 9:49 AM

## 2023-07-07 ENCOUNTER — Encounter: Payer: Self-pay | Admitting: Family Medicine
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
## 2023-09-12 ENCOUNTER — Encounter: Payer: Self-pay | Admitting: Cardiology

## 2023-09-12 ENCOUNTER — Ambulatory Visit: Payer: PRIVATE HEALTH INSURANCE | Attending: Cardiology | Admitting: Cardiology

## 2023-09-12 VITALS — BP 124/70 | HR 89 | Ht 68.75 in | Wt 158.2 lb

## 2023-09-12 DIAGNOSIS — I4711 Inappropriate sinus tachycardia, so stated: Secondary | ICD-10-CM

## 2023-10-24 ENCOUNTER — Other Ambulatory Visit: Payer: Self-pay | Admitting: Family Medicine

## 2023-10-24 DIAGNOSIS — G43009 Migraine without aura, not intractable, without status migrainosus: Secondary | ICD-10-CM

## 2023-11-24 ENCOUNTER — Other Ambulatory Visit: Payer: Self-pay | Admitting: Family Medicine

## 2023-11-24 DIAGNOSIS — G43009 Migraine without aura, not intractable, without status migrainosus: Secondary | ICD-10-CM

## 2023-12-22 ENCOUNTER — Other Ambulatory Visit: Payer: Self-pay | Admitting: Family Medicine

## 2023-12-22 DIAGNOSIS — G43009 Migraine without aura, not intractable, without status migrainosus: Secondary | ICD-10-CM

## 2023-12-27 ENCOUNTER — Ambulatory Visit: Payer: PRIVATE HEALTH INSURANCE | Admitting: Family Medicine

## 2024-01-19 ENCOUNTER — Other Ambulatory Visit: Payer: Self-pay | Admitting: Family Medicine

## 2024-01-19 DIAGNOSIS — G43009 Migraine without aura, not intractable, without status migrainosus: Secondary | ICD-10-CM

## 2024-01-21 DIAGNOSIS — M79643 Pain in unspecified hand: Secondary | ICD-10-CM | POA: Insufficient documentation

## 2024-01-21 DIAGNOSIS — S62309A Unspecified fracture of unspecified metacarpal bone, initial encounter for closed fracture: Secondary | ICD-10-CM | POA: Insufficient documentation

## 2024-01-21 DIAGNOSIS — R209 Unspecified disturbances of skin sensation: Secondary | ICD-10-CM | POA: Insufficient documentation

## 2024-01-22 ENCOUNTER — Encounter: Payer: Self-pay | Admitting: Family Medicine

## 2024-01-22 ENCOUNTER — Ambulatory Visit: Payer: PRIVATE HEALTH INSURANCE | Admitting: Family Medicine

## 2024-01-22 VITALS — BP 98/66 | HR 95 | Temp 98.7°F | Resp 14 | Ht 68.75 in | Wt 161.8 lb

## 2024-01-22 DIAGNOSIS — G43009 Migraine without aura, not intractable, without status migrainosus: Secondary | ICD-10-CM

## 2024-01-22 DIAGNOSIS — Z7184 Encounter for health counseling related to travel: Secondary | ICD-10-CM

## 2024-01-22 DIAGNOSIS — R000 Tachycardia, unspecified: Secondary | ICD-10-CM

## 2024-01-22 DIAGNOSIS — R002 Palpitations: Secondary | ICD-10-CM | POA: Diagnosis not present

## 2024-01-22 DIAGNOSIS — N911 Secondary amenorrhea: Secondary | ICD-10-CM | POA: Diagnosis not present

## 2024-01-22 LAB — POCT URINE PREGNANCY: Preg Test, Ur: NEGATIVE

## 2024-01-22 LAB — TSH: TSH: 1.16 u[IU]/mL (ref 0.35–5.50)

## 2024-01-22 MED ORDER — PROPRANOLOL HCL 20 MG PO TABS: 20.0000 mg | ORAL_TABLET | Freq: Two times a day (BID) | ORAL | 1 refills | Status: AC

## 2024-01-22 NOTE — Progress Notes (Unsigned)
 Subjective:  Patient ID: Hailey Higgins, female    DOB: [birth year]  Age: 33 y.o. MRN: [id number]  CC:  Chief Complaint  Patient presents with   Headache    Follow up on headaches. Getting better. Since Jan has had 2-3 headaches   Travel Consult    Still traveling. In December she has a couple trips planned.     HPI Hailey Higgins presents for   Headache disorder Discussed at her March visit, headaches for decades, unknown if migraine diagnosis but had improved overall.  Symptoms of photo photophobia, debilitating headache: Occurred, nausea without vomiting.  Once per month when discussed in March, bitemporal or occipital.  Treated with Tylenol , dark room, sleep.  Suspected migraine headaches.  Recommended to restart propranolol  for possible prevention of migraine headaches in addition to previous cardiac indication from cardiology -prior history of inappropriate sinus tachycardia.  Option of triptan but she plan to discuss with cardiologist first given cardiac history.  HA's have improved. Only 1-2 in past few months. Discussed triptan with cardiology - agreed with continuing propanolol BID. Has helped with palpitations. Defers further meds for HA at this time. Trying to stay hydrated.   Travel counseling Discussed in March.  Plan for travel to [place], [place], [place] and possibly [place].  CDC recommendations were discussed.  Zofran  was prescribed if needed for nausea vomiting or gastroenteritis symptoms.  Vaccine and nonvaccine preventable illnesses were discussed per [facility] website recommendations.  Did recommend flu vaccine, COVID booster in the fall.  Other vaccines per health department.  Cancelled [place], but still plans on [place], [place], [place] in December.  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 Defers covid booster. No high risk conditions.    Additional concern: Abnormal menses: LNMP 2/25, lasted few days. Normal flow. Sometimes has missed a month in past, irregular, but  more delayed now.  1 day of menses on 9/6. Moderate flow. None since.  Does have OBGYN. Last visit with them in January. Next appt in February.  Denies stressors. 2 negative pregnancy tests in June or July.  On Junel Fe for awhile - years. No missed doses.  No change in diet.  No weight loss.   Wt Readings from Last 3 Encounters:  01/22/24 161 lb 12.8 oz (73.4 kg)  09/12/23 158 lb 3.2 oz (71.8 kg)  07/05/23 159 lb 6.4 oz (72.3 kg)    History Patient Active Problem List   Diagnosis Date Noted   Fracture of metacarpal bone 01/21/2024   Hand pain 01/21/2024   Skin sensation disturbance 01/21/2024   Anxiety 06/27/2021   Chronic constipation 06/27/2021   Increased frequency of urination 11/25/2020   OAB (overactive bladder) 11/25/2020   Urinary urgency 11/25/2020   Carpal tunnel syndrome 11/16/2020   Past Medical History:  Diagnosis Date   ADHD (attention deficit hyperactivity disorder)    Past Surgical History:  Procedure Laterality Date   CLOSED REDUCTION TIBIAL FRACTURE Left 11/21/2015   EXTERNAL FIXATION ANKLE FRACTURE Left 11/21/2015   distal tibia   IM NAILING TIBIA Left 11/28/2015   INCISION AND DRAINAGE TIBIA Left 11/21/2015   ORIF CALCANEOUS FRACTURE Right 11/28/2015   PERCUTANEOUS PINNING TALUS FRACTURE Right 11/21/2015   talar neck fx.   Allergies  Allergen Reactions   Penicillin G Nausea And Vomiting   Prior to Admission medications   Medication Sig Start Date End Date Taking? Authorizing Provider  JUNEL FE 1/20 1-20 MG-MCG tablet Take 1 tablet by mouth daily. 05/26/22  Yes [provider]  lisdexamfetamine (VYVANSE) 30 MG capsule Take 30 mg by mouth daily.   Yes [provider]  ondansetron  (ZOFRAN ) 4 MG tablet Take 1 tablet (4 mg total) by mouth every 8 (eight) hours as needed for nausea or vomiting. 07/05/23  Yes Levora Reyes SAUNDERS, MD  propranolol  (INDERAL ) 20 MG tablet TAKE 1 TABLET BY MOUTH TWICE DAILY AS NEEDED FOR  ELEVATED  HEART  RATES  12/24/23  Yes Levora Reyes SAUNDERS, MD  trospium (SANCTURA) 20 MG tablet Take 20 mg by mouth 2 (two) times daily.   Yes [provider]   Social History   Socioeconomic History   Marital status: Single    Spouse name: Not on file   Number of children: Not on file   Years of education: Not on file   Highest education level: Not on file  Occupational History   Not on file  Tobacco Use   Smoking status: Never   Smokeless tobacco: Never  Vaping Use   Vaping status: Never Used  Substance and Sexual Activity   Alcohol use: Yes    Comment: rare   Drug use: No   Sexual activity: Yes    Birth control/protection: Pill  Other Topics Concern   Not on file  Social History Narrative   Not on file   Social Drivers of Health   Financial Resource Strain: Not on file  Food Insecurity: Low Risk  (04/30/2023)   Received from [facility]   Hunger Vital Sign    Within the past 12 months, you worried that your food would run out before you got money to buy more: Never true    Within the past 12 months, the food you bought just didn't last and you didn't have money to get more. : Never true  Transportation Needs: No Transportation Needs (04/30/2023)   Received from [facility]    In the past 12 months, has lack of reliable transportation kept you from medical appointments, meetings, work or from getting things needed for daily living? : No  Physical Activity: Not on file  Stress: Not on file  Social Connections: Not on file  Intimate Partner Violence: Unknown (08/19/2021)   Received from [facility]   Intimate Partner Violence    Fear of Current or Ex-Partner: Not on file    Review of Systems   Objective:   Vitals:   01/22/24 0911  BP: 98/66  Pulse: 95  Resp: 14  Temp: 98.7 F (37.1 C)  TempSrc: Temporal  SpO2: 95%  Weight: 161 lb 12.8 oz (73.4 kg)  Height: 5' 8.75 (1.746 m)     Physical Exam Vitals reviewed.  Constitutional:      Appearance:  Normal appearance. She is well-developed.  HENT:     Head: Normocephalic and atraumatic.  Eyes:     Conjunctiva/sclera: Conjunctivae normal.     Pupils: Pupils are equal, round, and reactive to light.  Neck:     Vascular: No carotid bruit.  Cardiovascular:     Rate and Rhythm: Normal rate and regular rhythm.     Heart sounds: Normal heart sounds.  Pulmonary:     Effort: Pulmonary effort is normal.     Breath sounds: Normal breath sounds.  Abdominal:     Palpations: Abdomen is soft. There is no pulsatile mass.     Tenderness: There is no abdominal tenderness.  Musculoskeletal:     Right lower leg: No edema.     Left lower leg: No edema.  Skin:    General: Skin is warm and dry.  Neurological:     General: No focal deficit present.     Mental Status: She is alert and oriented to person, place, and time. Mental status is at baseline.  Psychiatric:        Mood and Affect: Mood normal.        Behavior: Behavior normal.        Assessment & Plan:  Hailey Higgins is a 33 y.o. female . Migraine without aura and without status migrainosus, not intractable - Plan: propranolol  (INDERAL ) 20 MG tablet  Tachycardia - Plan: propranolol  (INDERAL ) 20 MG tablet  Intermittent palpitations - Plan: propranolol  (INDERAL ) 20 MG tablet  Counseling about travel  Secondary amenorrhea - Plan: POCT urine pregnancy, Estradiol, TSH, Prolactin, FSH   Meds ordered this encounter  Medications   propranolol  (INDERAL ) 20 MG tablet    Sig: Take 1 tablet (20 mg total) by mouth 2 (two) times daily.    Dispense:  180 tablet    Refill:  1   Patient Instructions  Thanks for coming in today.  I do recommend following up with your OB/GYN to discuss the change in menses.  I will check some of the initial lab work that I think they may want to see in that workup.  Urine pregnancy test was negative here as well today.  Glad to hear the headaches have improved, okay to stay on propranolol  same dose for now.   If increased headaches we can look at other acute treatment options.  Let me know.  Enjoy your travel in the next few months, and stay safe!  Please let me know if there are any questions from today's visit and take care.       Signed,   Reyes Pines, MD [facility], [facility] 01/22/24 10:02 AM

## 2024-01-22 NOTE — Patient Instructions (Signed)
 Thanks for coming in today.  I do recommend following up with your OB/GYN to discuss the change in menses.  I will check some of the initial lab work that I think they may want to see in that workup.  Urine pregnancy test was negative here as well today.  Glad to hear the headaches have improved, okay to stay on propranolol  same dose for now.  If increased headaches we can look at other acute treatment options.  Let me know.  Enjoy your travel in the next few months, and stay safe!  Please let me know if there are any questions from today's visit and take care.

## 2024-01-23 LAB — ESTRADIOL: Estradiol: 271 pg/mL

## 2024-01-23 LAB — PROLACTIN: Prolactin: 13.5 ng/mL

## 2024-01-23 LAB — FOLLICLE STIMULATING HORMONE: FSH: 2.7 m[IU]/mL

## 2024-01-27 ENCOUNTER — Ambulatory Visit: Payer: Self-pay | Admitting: Family Medicine

## 2024-07-22 ENCOUNTER — Encounter: Payer: PRIVATE HEALTH INSURANCE | Admitting: Family Medicine

## 8386-12-23 DEATH — deceased
